# Patient Record
Sex: Male | Born: 1965 | Hispanic: No | Marital: Married
Health system: Northeastern US, Academic
[De-identification: ages and names within clinical notes are randomized; demographics above are authoritative.]

---

## 2009-10-01 IMAGING — CT CT MAXILLOFACIAL W/O CM
1 series · 15 of 30 positions shown, 19 images · IV contrast (agent unspecified)
Comparison: none

CLINICAL DATA: Trauma.
MAXILLOFACIAL CT WITHOUT CONTRAST:
TECHNIQUE: Axial and coronal CT imaging was performed through the maxillofacial structures.  No intravenous contrast was administered.

[Series 3: facial 2.0 h32s · axial · 0.39mm/px · z∈[+16,+200]mm · 15 of 133 slices shown, 19 images]
[im 5/133  brain]
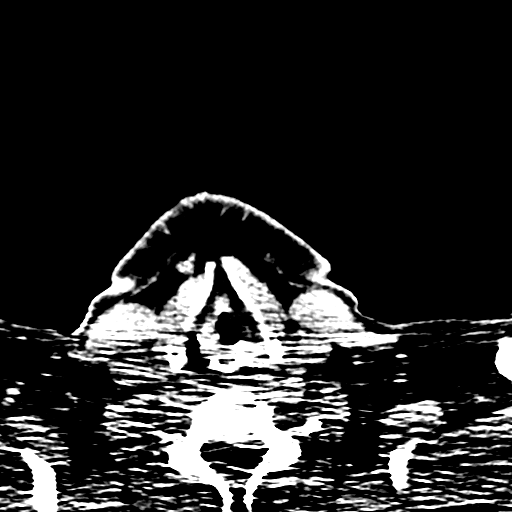
[im 5/133  bone]
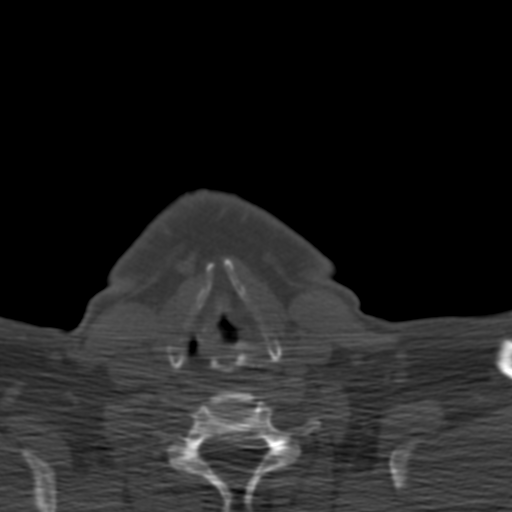
[im 14/133  bone]
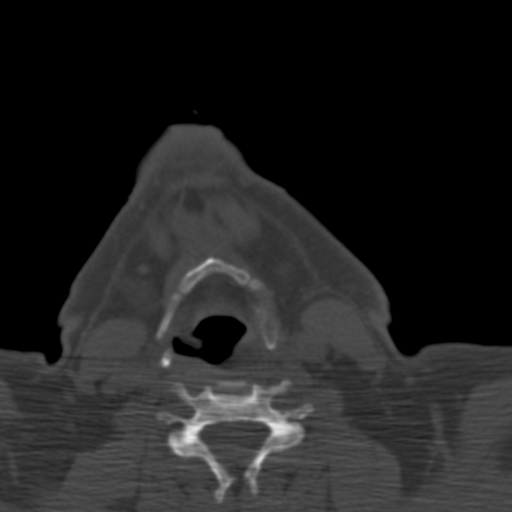
[im 23/133  bone]
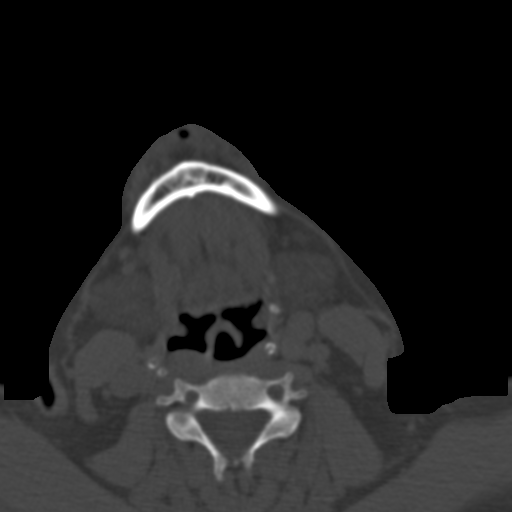
[im 32/133  bone]
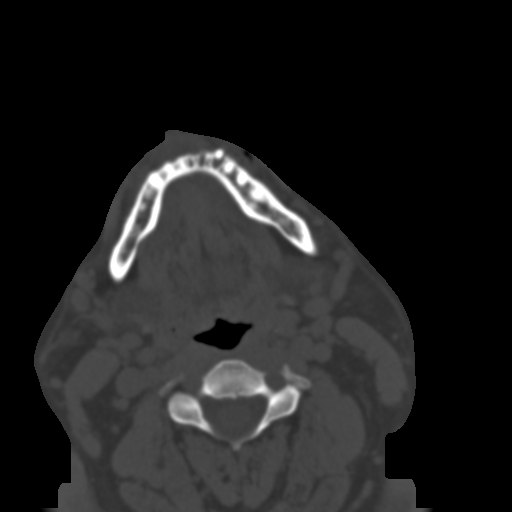
[im 41/133  brain]
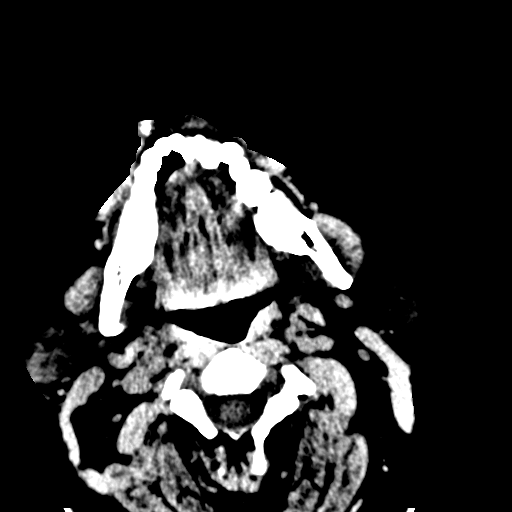
[im 41/133  bone]
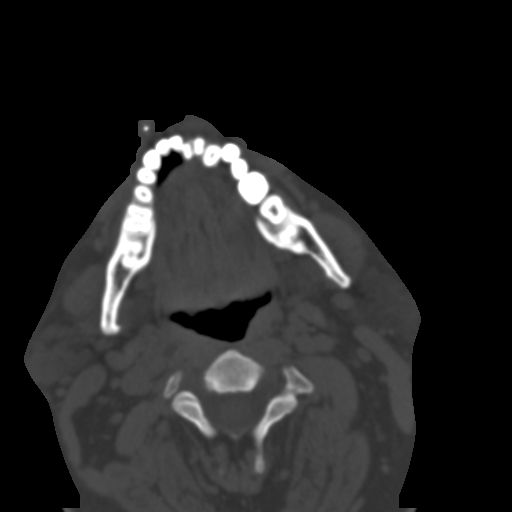
[im 51/133  bone]
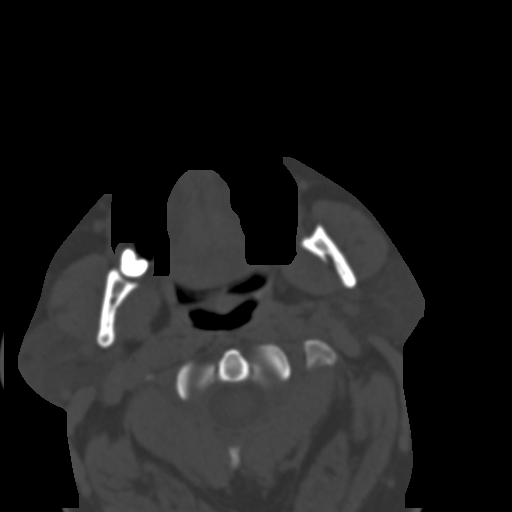
[im 60/133  bone]
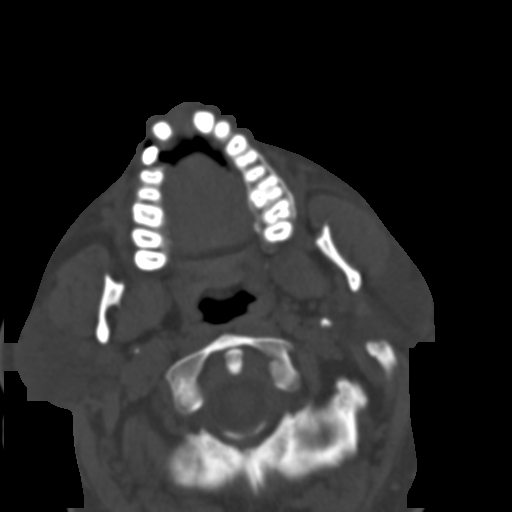
[im 69/133  bone]
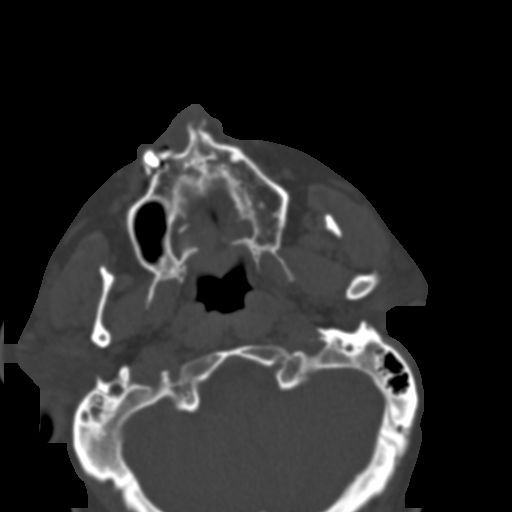
[im 73/133  brain]
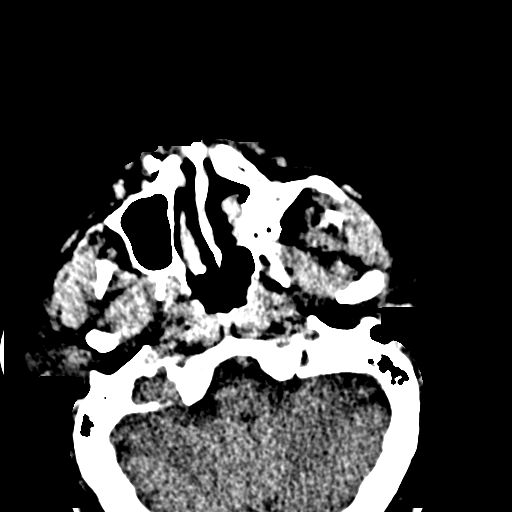
[im 73/133  bone]
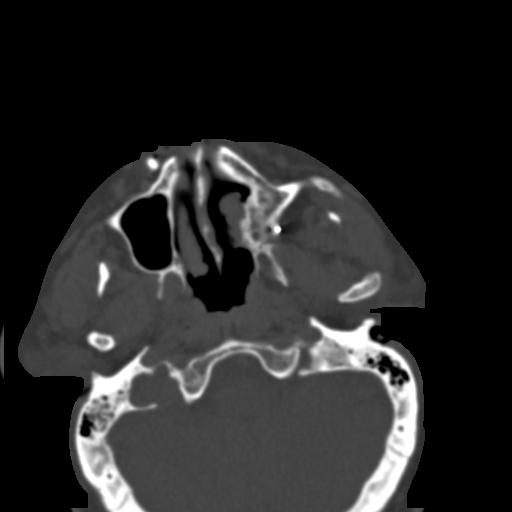
[im 82/133  bone]
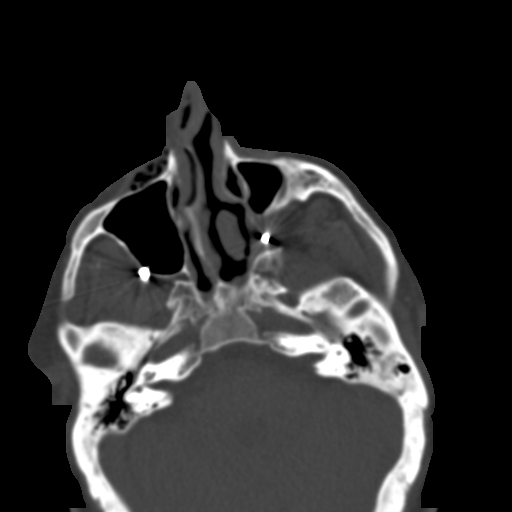
[im 92/133  bone]
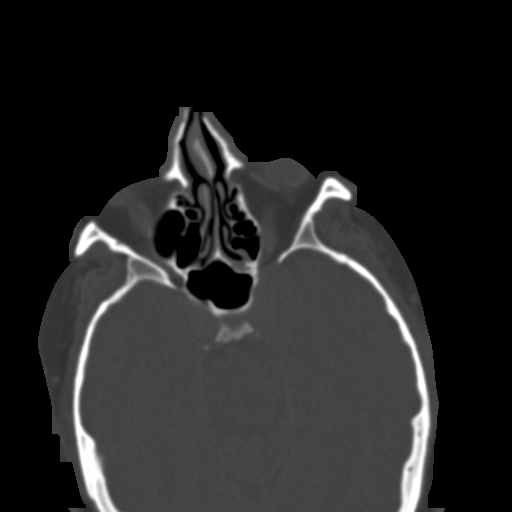
[im 101/133  bone]
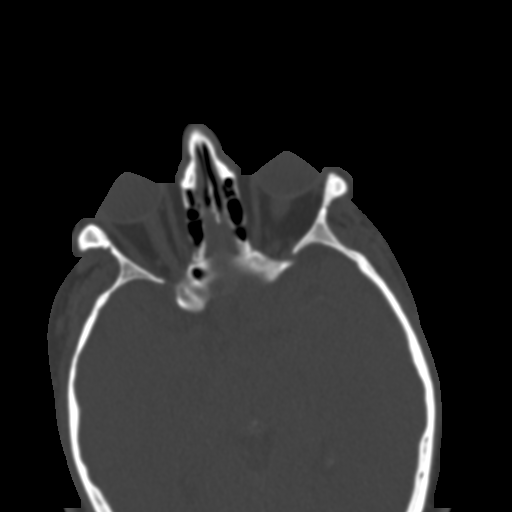
[im 110/133  brain]
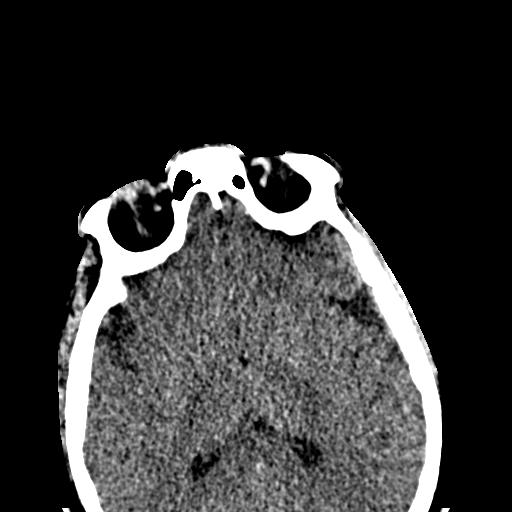
[im 110/133  bone]
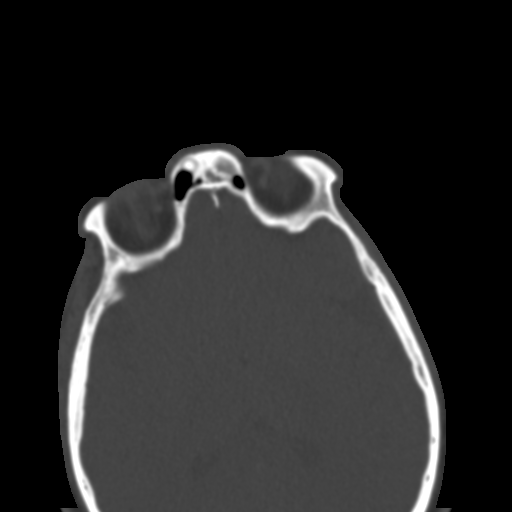
[im 119/133  bone]
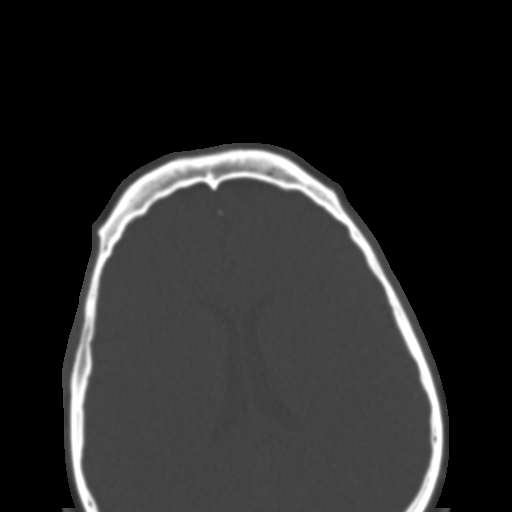
[im 128/133  bone]
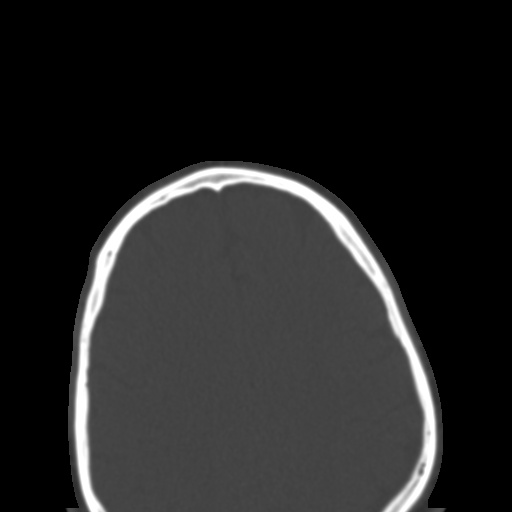

[15 of 30 positions shown; findings below may reference images not displayed]

FINDINGS: Internally sclerotic and peripherally hypodense area adjacent to the right frontal sinus in the frontal bone may represent an enchondroma or other benign etiology.  Bilateral age indeterminate nasal bone fractures are identified with leftward deviation of the nose.  Multiple nondisplaced midline and paramidline mandible and maxillary acute fracture lines are visible.  Overlying soft tissue swelling and soft tissue defect with radiopaque foreign bodies is noted.  Metallic hardware is in place at the posterior aspect of the left maxillary sinus which may reflect prior trauma as well as the posterolateral aspect of the right maxillary sinus.  No fracture line is seen there.  Cerclage wires also noted at the angle of the mandible on the right.  Material in the bilateral external auditory canals may represent cerumen.  Orbits are intact.
IMPRESSION: 1.  Acute midline and paramidline maxilla and mandibular fractures with overlying lower lip soft tissue defect.
2.  Bilateral nasal bone fractures.
Findings called to Dr. Gevara by Dr. Marinez at [DATE] on 12/23/06.

## 2019-04-02 LAB — CBC WITH AUTO DIFFERENTIAL
BKR WAM ABSOLUTE IMMATURE GRANULOCYTES: 0 x 1000/ÂµL (ref 0.0–0.3)
BKR WAM ABSOLUTE LYMPHOCYTE COUNT: 1.8 x 1000/ÂµL (ref 1.0–4.0)
BKR WAM ABSOLUTE NRBC: 0 x 1000/ÂµL (ref 0.0–0.0)
BKR WAM ANALYZER ANC: 3.8 x 1000/ÂµL (ref 1.0–11.0)
BKR WAM BASOPHIL ABSOLUTE COUNT: 0 x 1000/ÂµL (ref 0.0–0.0)
BKR WAM BASOPHILS: 0.3 % (ref 0.0–4.0)
BKR WAM EOSINOPHIL ABSOLUTE COUNT: 0.2 x 1000/ÂµL (ref 0.0–1.0)
BKR WAM EOSINOPHILS: 2.7 % (ref 0.0–7.0)
BKR WAM HEMATOCRIT: 51.9 % (ref 37.0–52.0)
BKR WAM HEMOGLOBIN: 17.3 g/dL (ref 12.0–18.0)
BKR WAM IMMATURE GRANULOCYTES: 0.4 % (ref 0.0–3.0)
BKR WAM LYMPHOCYTES: 26.9 % (ref 8.0–49.0)
BKR WAM MCH (PG): 30.7 pg (ref 27.0–31.0)
BKR WAM MCHC: 33.3 g/dL (ref 31.0–36.0)
BKR WAM MCV: 92.2 fL (ref 78.0–94.0)
BKR WAM MONOCYTE ABSOLUTE COUNT: 0.9 x 1000/ÂµL (ref 0.0–2.0)
BKR WAM MONOCYTES: 13.4 % (ref 4.0–15.0)
BKR WAM MPV: 10.7 fL (ref 6.0–11.0)
BKR WAM NEUTROPHILS: 56.3 % (ref 37.0–84.0)
BKR WAM NUCLEATED RED BLOOD CELLS: 0 % (ref 0.0–1.0)
BKR WAM PLATELETS: 164 x1000/ÂµL (ref 140–440)
BKR WAM RDW-CV: 12.8 % (ref 11.5–14.5)
BKR WAM RED BLOOD CELL COUNT: 5.6 M/ÂµL (ref 3.8–5.9)
BKR WAM WHITE BLOOD CELL COUNT: 6.7 x1000/ÂµL (ref 4.0–10.0)

## 2019-04-02 LAB — URINALYSIS-MACROSCOPIC W/REFLEX MICROSCOPIC
BKR BILIRUBIN, UA: NEGATIVE
BKR BLOOD, UA: NEGATIVE
BKR GLUCOSE, UA: NEGATIVE
BKR LEUKOCYTE ESTERASE, UA: NEGATIVE
BKR NITRITE, UA: NEGATIVE
BKR PH, UA: 6 (ref 5.5–7.5)
BKR SPECIFIC GRAVITY, UA: 1.038 — ABNORMAL HIGH (ref 1.005–1.030)
BKR UROBILINOGEN, UA: <2 EU/dL (ref <=2.0)

## 2019-04-02 LAB — URINE MICROSCOPIC     (BH GH LMW YH)
BKR HYALINE CASTS, UA INSTRUMENT (NUMERIC): 3 /LPF (ref 0–3)
BKR RBC/HPF INSTRUMENT: 2 /HPF (ref 0–2)
BKR WBC/HPF INSTRUMENT: 2 /HPF (ref 0–5)

## 2019-04-02 LAB — BASIC METABOLIC PANEL
BKR ANION GAP: 11 (ref 7–17)
BKR BLOOD UREA NITROGEN: 13 mg/dL (ref 6–20)
BKR BUN / CREAT RATIO: 17.6 (ref 8.0–23.0)
BKR CALCIUM: 9.1 mg/dL (ref 8.8–10.2)
BKR CHLORIDE: 99 mmol/L (ref 98–107)
BKR CO2: 27 mmol/L (ref 20–30)
BKR CREATININE: 0.74 mg/dL (ref 0.40–1.30)
BKR EGFR (AFR AMER): >60 mL/min/{1.73_m2} (ref >60)
BKR EGFR (NON AFRICAN AMERICAN): >60 mL/min/{1.73_m2} (ref >60)
BKR GLUCOSE: 134 mg/dL — ABNORMAL HIGH (ref 70–100)
BKR POTASSIUM: 4.9 mmol/L (ref 3.3–5.1)
BKR SODIUM: 137 mmol/L (ref 136–144)

## 2019-04-02 LAB — MAGNESIUM: BKR MAGNESIUM: 1.9 mg/dL (ref 1.7–2.4)

## 2019-05-18 ENCOUNTER — Inpatient Hospital Stay: Admit: 2019-05-18 | Discharge: 2019-05-18 | Payer: BLUE CROSS/BLUE SHIELD | Primary: Internal Medicine

## 2019-05-18 DIAGNOSIS — Z20828 Contact with and (suspected) exposure to other viral communicable diseases: Secondary | ICD-10-CM

## 2019-05-18 DIAGNOSIS — Z20822 Contact with and (suspected) exposure to covid-19: Secondary | ICD-10-CM

## 2019-05-20 LAB — COVID-19 CLEARANCE OR FOR PLACEMENT ONLY: BKR SARS-COV-2 RNA (COVID-19) (YH): NOT DETECTED

## 2019-09-29 ENCOUNTER — Inpatient Hospital Stay: Admit: 2019-09-29 | Discharge: 2019-09-29 | Payer: BLUE CROSS/BLUE SHIELD | Primary: Internal Medicine

## 2019-09-29 DIAGNOSIS — Z20828 Contact with and (suspected) exposure to other viral communicable diseases: Secondary | ICD-10-CM

## 2019-09-29 DIAGNOSIS — Z20822 Contact with and (suspected) exposure to covid-19: Secondary | ICD-10-CM

## 2019-09-30 LAB — SARS COV-2 (COVID-19) RNA-~~LOC~~ LABS (BH GH LMW YH): BKR SARS-COV-2 RNA (COVID-19) (YH): NOT DETECTED

## 2019-12-01 ENCOUNTER — Inpatient Hospital Stay: Admit: 2019-12-01 | Discharge: 2019-12-01 | Payer: BLUE CROSS/BLUE SHIELD

## 2019-12-01 ENCOUNTER — Emergency Department: Admit: 2019-12-01 | Payer: PRIVATE HEALTH INSURANCE | Attending: Diagnostic Radiology | Primary: Internal Medicine

## 2019-12-01 DIAGNOSIS — R109 Unspecified abdominal pain: Principal | ICD-10-CM

## 2019-12-01 DIAGNOSIS — Z79899 Other long term (current) drug therapy: Secondary | ICD-10-CM

## 2019-12-01 DIAGNOSIS — N200 Calculus of kidney: Secondary | ICD-10-CM

## 2019-12-01 DIAGNOSIS — R112 Nausea with vomiting, unspecified: Secondary | ICD-10-CM

## 2019-12-01 DIAGNOSIS — R197 Diarrhea, unspecified: Secondary | ICD-10-CM

## 2019-12-01 DIAGNOSIS — Z7984 Long term (current) use of oral hypoglycemic drugs: Secondary | ICD-10-CM

## 2019-12-01 DIAGNOSIS — Z885 Allergy status to narcotic agent status: Secondary | ICD-10-CM

## 2019-12-01 LAB — BASIC METABOLIC PANEL
BKR ANION GAP: 12 % (ref 7–17)
BKR BLOOD UREA NITROGEN: 16 mg/dL (ref 6–20)
BKR BUN / CREAT RATIO: 18.6 (ref 8.0–23.0)
BKR CALCIUM: 10 mg/dL (ref 8.8–10.2)
BKR CHLORIDE: 101 mmol/L (ref 98–107)
BKR CO2: 29 mmol/L (ref 20–30)
BKR CREATININE: 0.86 mg/dL (ref 0.40–1.30)
BKR EGFR (AFR AMER): >60 mL/min/{1.73_m2} (ref >60)
BKR EGFR (NON AFRICAN AMERICAN): >60 mL/min/{1.73_m2} (ref >60)
BKR GLUCOSE: 169 mg/dL — ABNORMAL HIGH (ref 70–100)
BKR POTASSIUM: 4 mmol/L (ref 3.3–5.1)
BKR SODIUM: 142 mmol/L (ref 136–144)

## 2019-12-01 LAB — CBC WITH AUTO DIFFERENTIAL
BKR GLUCOSE, UA: 0.3 x 1000/??L (ref 0.0–1.0)
BKR WAM ABSOLUTE IMMATURE GRANULOCYTES: 0 x 1000/??L (ref 0.0–0.3)
BKR WAM ABSOLUTE LYMPHOCYTE COUNT: 3.1 x 1000/??L (ref 1.0–4.0)
BKR WAM ABSOLUTE NRBC: 0 x 1000/ÂµL (ref 0.0–0.0)
BKR WAM ANALYZER ANC: 5.3 x 1000/ÂµL (ref 1.0–11.0)
BKR WAM BASOPHIL ABSOLUTE COUNT: 0.1 x 1000/??L — ABNORMAL HIGH (ref 0.0–0.0)
BKR WAM BASOPHILS: 0.5 % (ref 0.0–4.0)
BKR WAM EOSINOPHIL ABSOLUTE COUNT: 0.3 x 1000/ÂµL (ref 0.0–1.0)
BKR WAM EOSINOPHILS: 2.9 % — AB (ref 0.0–7.0)
BKR WAM HEMATOCRIT: 48.2 % (ref 37.0–52.0)
BKR WAM HEMOGLOBIN: 16.5 g/dL (ref 12.0–18.0)
BKR WAM IMMATURE GRANULOCYTES: 0.3 % (ref 0.0–3.0)
BKR WAM LYMPHOCYTES: 33.1 % (ref 8.0–49.0)
BKR WAM MCH (PG): 31.3 pg — ABNORMAL HIGH (ref 27.0–31.0)
BKR WAM MCHC: 34.2 g/dL (ref 31.0–36.0)
BKR WAM MCV: 91.5 fL (ref 78.0–94.0)
BKR WAM MONOCYTE ABSOLUTE COUNT: 0.7 x 1000/ÂµL (ref 0.0–2.0)
BKR WAM MONOCYTES: 7.8 % (ref 4.0–15.0)
BKR WAM MPV: 10.7 fL (ref 6.0–11.0)
BKR WAM NEUTROPHILS: 55.4 % (ref 37.0–84.0)
BKR WAM NUCLEATED RED BLOOD CELLS: 0 % (ref 0.0–1.0)
BKR WAM PLATELETS: 235 x1000/ÂµL (ref 140–440)
BKR WAM RDW-CV: 12.5 % (ref 11.5–14.5)
BKR WAM RED BLOOD CELL COUNT: 5.3 M/??L (ref 3.8–5.9)
BKR WAM WHITE BLOOD CELL COUNT: 9.5 x1000/ÂµL (ref 4.0–10.0)

## 2019-12-01 LAB — URINE MICROSCOPIC     (BH GH LMW YH): BKR RBC/HPF: >30 /HPF — AB (ref 0–2)

## 2019-12-01 LAB — URINALYSIS WITH CULTURE REFLEX      (BH LMW YH)
BKR BILIRUBIN, UA: NEGATIVE
BKR KETONES, UA: NEGATIVE % — AB (ref 0.0–4.0)
BKR LEUKOCYTE ESTERASE, UA: NEGATIVE
BKR NITRITE, UA: NEGATIVE % (ref 0.0–1.0)
BKR PH, UA: 6 /HPF (ref 5.5–7.5)
BKR PROTEIN, UA: NEGATIVE
BKR SPECIFIC GRAVITY, UA: 1.029 — ABNORMAL HIGH (ref 1.005–1.020)
BKR UROBILINOGEN, UA: 0.2 EU/dL (ref <=2.0)

## 2019-12-01 LAB — UA REFLEX CULTURE

## 2019-12-01 MED ORDER — SODIUM CHLORIDE 0.9 % BOLUS (NEW BAG)
0.9 % | INTRAVENOUS | Status: CP
Start: 2019-12-01 — End: ?
  Administered 2019-12-01: 22:00:00 0.9 mL/h via INTRAVENOUS

## 2019-12-01 MED ORDER — KETOROLAC 15 MG/ML INJECTION SOLUTION
15 mg/mL | Freq: Once | INTRAVENOUS | Status: CP
Start: 2019-12-01 — End: ?
  Administered 2019-12-01: 20:00:00 15 mL via INTRAVENOUS

## 2019-12-01 MED ORDER — ATORVASTATIN 10 MG TABLET
10 mg | Freq: Every evening | ORAL | Status: AC
Start: 2019-12-01 — End: ?

## 2019-12-01 MED ORDER — METFORMIN IMMEDIATE RELEASE 500 MG TABLET
500 mg | Freq: Two times a day (BID) | ORAL | Status: AC
Start: 2019-12-01 — End: ?

## 2019-12-01 NOTE — Discharge Instructions
Please stay hydrated, drink plenty of fluids, take ibuprofen as needed for flank pain, and follow-up with your doctor in 1 week to try to speak with him about why you get recurrent kidney stones.  Your lab showed no signs of infection.  If you develop worsening pain, nausea, vomiting, worsening abdominal pain, or other concerning symptoms please call your doctor or return to the emergency department.

## 2019-12-01 NOTE — ED Notes
3:17 PM Pt upset about wait time and requesting to be seen immediately. Pt educated regarding wait time and he will be seen as soon as a room is available. Pt verbalized understanding.

## 2019-12-01 NOTE — ED Notes
3:47 PM Pt ambulatory from triage & changed into gown. Pt a&o x4, speaking in clear & full sentences. Pt complaining of left sided flank pain beginning 2 hours ago. Pt reports intermittent pain. Pt anxious and restless in stretcher I can't get comfortable. Pt reports hx of kidney stones. Pt denying any dysuria, abd pain, fevers/chills, n/v. PIV placed, labs sent. Awaiting provider eval. Will cont to monitor. No past medical history on file.6:00 PM 2nd liter of NS Infusing. Pt reporting improvement in pain now 3/10 to left flank. Pending dispo. 6:36 PMPt ambulatory to bathroom w/ steady gait. Pending dispo. VSS. 6:55 PMPt verbalized understanding of d/c instructions. VSS. PIV removed. Pt ambulatory out of ED w/ steady gait. Pt denying any chest pain/SOB and any other concerns at this time.

## 2019-12-02 NOTE — ED Provider Notes
HistoryChief Complaint Patient presents with ? Flank Pain   Pt c/o left flank pain x1-2 hrs that occurred while at grocery store. H/o kidney stones, reporters similar pain. Pt reports one episode nonbloody emesis and one episode of nonbloody diarrhea today. Denies dysuria, hematuria, fever, chills, CP, SOB, cough. Reports covid 19 vaccinated. A/Ox4. Speaking full, clear sentences. Pt noted to be visibly uncomfortable in triage.   54 year old male with a history of kidney stones presents emergency department after sudden onset of left-sided flank pain while he was at the store.  Patient reports the pain is constant with waves of amplitude.  Patient had an episode of nausea and vomited once.  Patient denies fever, recent heavy lifting, falls, belly pain, chest pain, shortness of breath, cough.  Patient states this feels like kidney stones that he has had in the past.The history is provided by the patient. OtherThis is a new problem. The current episode started 1 to 2 hours ago. The problem occurs constantly. The problem has not changed since onset.Pertinent negatives include no chest pain, no abdominal pain, no headaches and no shortness of breath. Nothing aggravates the symptoms. Nothing relieves the symptoms. He has tried nothing for the symptoms. The treatment provided no relief.  No past medical history on file.No past surgical history on file.No family history on file.Social History Socioeconomic History ? Marital status: Married   Spouse name: Not on file ? Number of children: Not on file ? Years of education: Not on file ? Highest education level: Not on file Tobacco Use ? Smoking status: Never Smoker Substance and Sexual Activity ? Alcohol use: No ? Drug use: No Social History Narrative  ** Merged History Encounter **    ED Other Social History E-cigarette/Vaping Substances E-cigarette/Vaping Devices Review of Systems Constitutional: Negative for chills, fatigue and fever. HENT: Negative for congestion.  Respiratory: Negative for apnea, chest tightness and shortness of breath.  Cardiovascular: Negative for chest pain. Gastrointestinal: Positive for nausea and vomiting. Negative for abdominal pain. Genitourinary: Positive for flank pain. Negative for difficulty urinating. Musculoskeletal: Negative for arthralgias and back pain. Neurological: Negative for headaches. All other systems reviewed and are negative. Physical ExamED Triage Vitals [12/01/19 1514]BP: (!) 159/94Pulse: 67Pulse from  O2 sat: n/aResp: 20Temp: 97.1 ?F (36.2 ?C)Temp src: TemporalSpO2: 98 % BP 132/86  - Pulse 81  - Temp 97.1 ?F (36.2 ?C) (Temporal)  - Resp 20  - Ht 5' 8 (1.727 m)  - Wt 108.9 kg  - SpO2 98%  - BMI 36.49 kg/m? Physical ExamVitals and nursing note reviewed. Constitutional:     Appearance: Normal appearance. He is obese. HENT:    Head: Normocephalic. Eyes:    Extraocular Movements: Extraocular movements intact.    Conjunctiva/sclera: Conjunctivae normal.    Pupils: Pupils are equal, round, and reactive to light. Cardiovascular:    Rate and Rhythm: Normal rate and regular rhythm.    Pulses: Normal pulses.    Heart sounds: Normal heart sounds. Pulmonary:    Effort: Pulmonary effort is normal.    Breath sounds: Normal breath sounds. Abdominal:    General: Abdomen is flat. There is no distension.    Tenderness: There is no abdominal tenderness. There is left CVA tenderness. There is no right CVA tenderness. Musculoskeletal:       General: Tenderness present. No deformity. Normal range of motion.    Cervical back: Normal range of motion and neck supple. Skin:   General: Skin is warm and dry.    Capillary Refill: Capillary refill  takes less than 2 seconds.    Findings: No rash. Neurological:    General: No focal deficit present.    Mental Status: He is alert and oriented to person, place, and time. Psychiatric:       Behavior: Behavior normal.  ProceduresProceduresResident/APP MDM:- likely kidney stone, no abdominal tenderness so less concerning for abdominal pathology, less concern for MSK pain with no inciting incident.- patient feels better after Toradol and 1 L of IV fluids.  Patient feels that symptoms have almost resolved.  Will give patient 1 more L of fluids and reassess- patient feels full relief of symptoms.  Patient will follow-up with his primary care doctor to talk about why he gets recurrent kidney stones.ED COURSEReviewed previous: previous chartInterpreted by ED Provider: labs, ultrasound and pulse oximetryPatient Reevaluation: ED Attestation: PA/APRNFace to face evaluation was performed by me in collaboration with the Advanced Practice Provider to assess for significant health threats. On my exam:  54 year old male with past medical history of renal stones presents to the ED with acute onset of left flank pain.  Patient reports that this feels like his prior renal stones.  Left CVA tenderness on exam.  No fevers.  Denies any dysuria or hematuria.  Had 1 episode of vomiting.  My differential includes:  Renal stone, UTI, pyelonephritis.  Urinalysis positive for hematuria, no leukocytes or nitrites or bacteria.  No hydronephrosis on bedside renal ultrasound.  Given normal saline and Toradol and symptoms improved.  Disposition:  DischargeCaitlin Ryus, MD, MPHEmergency MedicineContact available on MHCritical care provided by attending: no critical carePatient progress: improvedClinical Impressions as of Nov 30 2132 Kidney stone  ED DispositionDischarge Ryus, Luther Parody, MD09/19/21 1854 Sherron Monday, PA09/19/21 2134

## 2020-02-17 ENCOUNTER — Inpatient Hospital Stay: Admit: 2020-02-17 | Discharge: 2020-02-17 | Payer: BLUE CROSS/BLUE SHIELD

## 2020-02-17 ENCOUNTER — Encounter: Admit: 2020-02-17 | Payer: PRIVATE HEALTH INSURANCE | Attending: Pediatrics | Primary: Internal Medicine

## 2020-02-17 ENCOUNTER — Emergency Department: Admit: 2020-02-17 | Payer: PRIVATE HEALTH INSURANCE | Primary: Internal Medicine

## 2020-02-17 DIAGNOSIS — N202 Calculus of kidney with calculus of ureter: Secondary | ICD-10-CM

## 2020-02-17 DIAGNOSIS — Z885 Allergy status to narcotic agent status: Secondary | ICD-10-CM

## 2020-02-17 DIAGNOSIS — Z79899 Other long term (current) drug therapy: Secondary | ICD-10-CM

## 2020-02-17 DIAGNOSIS — R109 Unspecified abdominal pain: Principal | ICD-10-CM

## 2020-02-17 DIAGNOSIS — Z7984 Long term (current) use of oral hypoglycemic drugs: Secondary | ICD-10-CM

## 2020-02-17 LAB — CBC WITH AUTO DIFFERENTIAL
BKR WAM ABSOLUTE IMMATURE GRANULOCYTES.: 0.06 x 1000/??L (ref 0.00–0.30)
BKR WAM ABSOLUTE LYMPHOCYTE COUNT.: 2.84 x 1000/??L (ref 0.60–3.70)
BKR WAM ABSOLUTE NRBC (2 DEC): 0 x 1000/??L (ref 0.00–1.00)
BKR WAM ANALYZER ANC: 5.41 x 1000/??L (ref 2.00–7.60)
BKR WAM BASOPHIL ABSOLUTE COUNT.: 0.07 x 1000/??L (ref 0.00–1.00)
BKR WAM BASOPHILS: 0.8 % (ref 0.0–1.4)
BKR WAM EOSINOPHIL ABSOLUTE COUNT.: 0 x 1000/??L (ref 0.00–1.00)
BKR WAM EOSINOPHILS: 0 % (ref 0.0–5.0)
BKR WAM HEMATOCRIT (2 DEC): 47.3 % (ref 38.50–50.00)
BKR WAM HEMOGLOBIN: 16.7 g/dL (ref 13.2–17.1)
BKR WAM IMMATURE GRANULOCYTES: 0.7 % (ref 0.0–1.0)
BKR WAM LYMPHOCYTES: 31.6 % (ref 17.0–50.0)
BKR WAM MCH (PG): 31.8 pg (ref 27.0–33.0)
BKR WAM MCHC: 35.3 g/dL (ref 31.0–36.0)
BKR WAM MCV: 90.1 fL (ref 80.0–100.0)
BKR WAM MONOCYTE ABSOLUTE COUNT.: 0.6 x 1000/??L (ref 0.00–1.00)
BKR WAM MONOCYTES: 6.7 % (ref 4.0–12.0)
BKR WAM MPV: 10.4 fL (ref 8.0–12.0)
BKR WAM NEUTROPHILS: 60.2 % (ref 39.0–72.0)
BKR WAM NUCLEATED RED BLOOD CELLS: 0 % (ref 0.0–1.0)
BKR WAM PLATELETS: 243 x1000/??L (ref 150–420)
BKR WAM RDW-CV: 12.5 % (ref 11.0–15.0)
BKR WAM RED BLOOD CELL COUNT.: 5.25 M/ÂµL (ref 4.00–6.00)
BKR WAM WHITE BLOOD CELL COUNT: 9 x1000/??L (ref 4.0–11.0)

## 2020-02-17 LAB — URINALYSIS WITH CULTURE REFLEX      (BH LMW YH)
BKR BILIRUBIN, UA: NEGATIVE mg/dL (ref 6–20)
BKR KETONES, UA: NEGATIVE
BKR LEUKOCYTE ESTERASE, UA: NEGATIVE
BKR NITRITE, UA: NEGATIVE
BKR PH, UA: 5.5 (ref 5.5–7.5)
BKR PROTEIN, UA: NEGATIVE
BKR SPECIFIC GRAVITY, UA: 1.027 — ABNORMAL HIGH (ref 1.005–1.020)
BKR UROBILINOGEN, UA: 0.2 EU/dL (ref <=2.0)

## 2020-02-17 LAB — BASIC METABOLIC PANEL
BKR ANION GAP: 13 g/dL (ref 7–17)
BKR BLOOD UREA NITROGEN: 13 mg/dL (ref 6–20)
BKR BUN / CREAT RATIO: 17.8 % (ref 8.0–23.0)
BKR CALCIUM: 9.7 mg/dL (ref 8.8–10.2)
BKR CHLORIDE: 99 mmol/L (ref 98–107)
BKR CO2: 27 mmol/L (ref 20–30)
BKR CREATININE: 0.73 mg/dL (ref 0.40–1.30)
BKR EGFR (AFR AMER): >60 mL/min/{1.73_m2} (ref >60)
BKR EGFR (NON AFRICAN AMERICAN): >60 mL/min/1.73m2 (ref >60)
BKR GLUCOSE, UA: 27 mmol/L (ref 20–30)
BKR GLUCOSE: 183 mg/dL — ABNORMAL HIGH (ref 70–100)
BKR POTASSIUM: 4.5 mmol/L (ref 3.3–5.3)
BKR SODIUM: 139 mmol/L — ABNORMAL HIGH (ref 136–144)

## 2020-02-17 LAB — URINE MICROSCOPIC     (BH GH LMW YH)

## 2020-02-17 LAB — UA REFLEX CULTURE

## 2020-02-17 MED ORDER — SODIUM CHLORIDE 0.9 % BOLUS (NEW BAG)
0.9 % | INTRAVENOUS | Status: CP
Start: 2020-02-17 — End: ?
  Administered 2020-02-17: 14:00:00 0.9 mL/h via INTRAVENOUS

## 2020-02-17 MED ORDER — MORPHINE 4 MG/ML INTRAVENOUS SOLUTION
4 mg/mL | Freq: Once | INTRAVENOUS | Status: CP
Start: 2020-02-17 — End: ?
  Administered 2020-02-17: 16:00:00 4 mL via INTRAVENOUS

## 2020-02-17 MED ORDER — TAMSULOSIN 0.4 MG CAPSULE
0.4 mg | Status: CP
Start: 2020-02-17 — End: ?

## 2020-02-17 MED ORDER — SODIUM CHLORIDE 0.9 % BOLUS (NEW BAG)
0.9 % | INTRAVENOUS | Status: CP
Start: 2020-02-17 — End: ?
  Administered 2020-02-17: 16:00:00 0.9 mL/h via INTRAVENOUS

## 2020-02-17 MED ORDER — OXYCODONE-ACETAMINOPHEN 5 MG-325 MG TABLET
5-325 mg | ORAL_TABLET | Freq: Three times a day (TID) | ORAL | 1 refills | Status: AC | PRN
Start: 2020-02-17 — End: ?

## 2020-02-17 MED ORDER — KETOROLAC 15 MG/ML INJECTION SOLUTION
15 mg/mL | Freq: Once | INTRAVENOUS | Status: CP
Start: 2020-02-17 — End: ?
  Administered 2020-02-17: 14:00:00 15 mL via INTRAVENOUS

## 2020-02-17 MED ORDER — TAMSULOSIN 0.4 MG CAPSULE
0.4 mg | Freq: Once | ORAL | Status: CP
Start: 2020-02-17 — End: ?
  Administered 2020-02-17: 19:00:00 0.4 mg via ORAL

## 2020-02-17 MED ORDER — OXYCODONE-ACETAMINOPHEN 5 MG-325 MG TABLET
5-325 mg | Freq: Once | ORAL | Status: CP
Start: 2020-02-17 — End: ?
  Administered 2020-02-17: 19:00:00 5-325 mg via ORAL

## 2020-02-17 MED ORDER — IBUPROFEN 600 MG TABLET
600 mg | Freq: Once | ORAL | Status: CP
Start: 2020-02-17 — End: ?
  Administered 2020-02-17: 18:00:00 600 mg via ORAL

## 2020-02-17 MED ORDER — TAMSULOSIN 0.4 MG CAPSULE
0.4 mg | ORAL_CAPSULE | Freq: Every day | ORAL | 1 refills | Status: AC
Start: 2020-02-17 — End: ?

## 2020-02-17 MED ORDER — MORPHINE 4 MG/ML INTRAVENOUS SOLUTION
4 mg/mL | Freq: Once | INTRAVENOUS | Status: CP
Start: 2020-02-17 — End: ?
  Administered 2020-02-17: 19:00:00 4 mL via INTRAVENOUS

## 2020-02-17 MED ORDER — OXYCODONE-ACETAMINOPHEN 5 MG-325 MG TABLET
5-325 mg | Freq: Once | ORAL | Status: CP
Start: 2020-02-17 — End: ?
  Administered 2020-02-17: 16:00:00 5-325 mg via ORAL

## 2020-02-17 NOTE — ED Notes
8:38 AM Pain med given per MAR. Urine collected and sent. Pending results, pt verbalized understanding POC. 11:08 AM Pain meds given per MAR, add't IVF given per [facility], will continue to monitor. 12:54 PM Pt stated some relief w/ pain meds, MD notified, will have [place] scan. 2:07 PM MD at bedside updating pt on results. 2:20 PM Pt ok for d/c per MD. Pt verbalized understanding of all discharge paperwork explained by RN Nolberto Hanlon. PIV removed. VSS. Pt denied any further questions. A/O4x, in no acute distress, pain controlled. Pt ambulated around ED with steady gait w/ all personal belongings.

## 2020-02-17 NOTE — Discharge Instructions
Your [place] scan shows a kidney stone.  Take the tamsulosin as prescribed.  Take ibuprofen 600 milligrams every 8 hours with food in addition to the Percocet as prescribed for pain.  Follow-up with your doctor.  Use the strainer to see if you can catch the kidney stone so your doctor can analyze this.  Come back to the ER for any worse pain, fever, trouble urinating, vomiting or any other changes or concerns.

## 2020-02-17 NOTE — ED Provider Notes
HistoryChief Complaint Patient presents with ? Flank Pain   left sided flank pain  The history is provided by the patient. OtherThis is a new problem. The current episode started 1 to 2 hours ago. The problem occurs constantly. Associated symptoms include abdominal pain. Pertinent negatives include no chest pain, no headaches and no shortness of breath. Nothing aggravates the symptoms. Nothing relieves the symptoms. The treatment provided no relief.  No past medical history on file.No past surgical history on file.No family history on file.Social History Socioeconomic History ? Marital status: Married   Spouse name: Not on file ? Number of children: Not on file ? Years of education: Not on file ? Highest education level: Not on file Tobacco Use ? Smoking status: Never Smoker Substance and Sexual Activity ? Alcohol use: No ? Drug use: No Social History Narrative  ** Merged History Encounter **    ED Other Social History E-cigarette/Vaping Substances E-cigarette/Vaping Devices Review of Systems Constitutional: Negative for fever. Respiratory: Negative for shortness of breath.  Cardiovascular: Negative for chest pain. Gastrointestinal: Positive for abdominal pain. Negative for nausea and vomiting. Genitourinary: Positive for difficulty urinating and flank pain. Negative for dysuria. Neurological: Negative for headaches. All other systems reviewed and are negative. Physical ExamED Triage Vitals [02/17/20 0813]BP: (!) 158/103Pulse: 77Pulse from  O2 sat: n/aResp: 20Temp: 97.9 ?F (36.6 ?C)Temp src: OralSpO2: 97 % BP (!) 136/94  - Pulse 76  - Temp 97.8 ?F (36.6 ?C) (Temporal)  - Resp 18  - Ht 5' 8 (1.727 m)  - Wt 108.9 kg  - SpO2 97%  - BMI 36.49 kg/m? Physical ExamVitals and nursing note reviewed. Constitutional:     General: He is not in acute distress.   Appearance: He is well-developed. He is not diaphoretic. HENT:    Head: Normocephalic. Eyes:    Extraocular Movements: Extraocular movements intact. Cardiovascular:    Rate and Rhythm: Normal rate. Pulmonary:    Effort: Pulmonary effort is normal. No respiratory distress. Abdominal:    Palpations: Abdomen is soft.    Tenderness: There is no abdominal tenderness. There is left CVA tenderness. Musculoskeletal:       General: Normal range of motion.    Cervical back: Normal range of motion and neck supple. Skin:   General: Skin is warm and dry. Neurological:    General: No focal deficit present.    Mental Status: He is alert and oriented to person, place, and time. Psychiatric:       Mood and Affect: Mood normal.       Behavior: Behavior normal.       Thought Content: Thought content normal.       Judgment: Judgment normal.  ProceduresProcedures ED COURSEPatient Reevaluation: Patient with history of kidney stones, last seen here several months ago for the same symptoms, now feels identical, left-sided flank pain, radiates forward, no urinary symptoms although has been going only small amounts.  No fever, he is not worried about any other etiology, for this reason no indication for imaging since this would not really change management, even if he has mild hydro on bedside ultrasound.  In for labs to check creatinine and UA, treat symptomatically and reassess.2:15 PMHad persistent pain so [place] scan was performed, has a smaller stone that should pass, feeling somewhat improved, will give another round of medication and then a short course of Percocet for home, take this with ibuprofen along with Flomax, follow-up with his doctor, he is comfortable with this plan, strict return precautions discussed, safe  for discharge.Clinical Impressions as of Feb 17 1420 Kidney stone  ED DispositionDischarge Arther Dames, MD12/06/21 1421

## 2020-02-17 NOTE — ED Notes
8:36 AM patient presents with co left flank pain. He states pain same as last kidney stone. Co flank pain no radiation to abdomen. Increased urge to urinate, decreased flow. Medicated as ordered

## 2020-02-17 NOTE — ED Notes
2:31 PM patient medicated for pain per order

## 2020-02-19 ENCOUNTER — Inpatient Hospital Stay: Admit: 2020-02-19 | Discharge: 2020-02-19 | Payer: BLUE CROSS/BLUE SHIELD

## 2020-02-19 ENCOUNTER — Encounter: Admit: 2020-02-19 | Payer: PRIVATE HEALTH INSURANCE | Primary: Internal Medicine

## 2020-02-19 LAB — BASIC METABOLIC PANEL
BKR ANION GAP: 10 (ref 7–17)
BKR BLOOD UREA NITROGEN: 12 mg/dL (ref 6–20)
BKR BUN / CREAT RATIO: 10.5 (ref 8.0–23.0)
BKR CALCIUM: 9.4 mg/dL (ref 8.8–10.2)
BKR CHLORIDE: 98 mmol/L (ref 98–107)
BKR CO2: 28 mmol/L (ref 20–30)
BKR CREATININE: 1.14 mg/dL (ref 0.40–1.30)
BKR EGFR (AFR AMER): >60 mL/min/{1.73_m2} (ref >60)
BKR EGFR (NON AFRICAN AMERICAN): >60 mL/min/{1.73_m2} (ref >60)
BKR GLUCOSE: 116 mg/dL — ABNORMAL HIGH (ref 70–100)
BKR POTASSIUM: 4.3 mmol/L (ref 3.3–5.3)
BKR SODIUM: 136 mmol/L (ref 136–144)
BKR WAM HEMOGLOBIN: 10 g/dL (ref 7–17)

## 2020-02-19 LAB — URINALYSIS WITH CULTURE REFLEX      (BH LMW YH)
BKR BILIRUBIN, UA: NEGATIVE
BKR BLOOD, UA: NEGATIVE
BKR GLUCOSE, UA: NEGATIVE
BKR LEUKOCYTE ESTERASE, UA: NEGATIVE
BKR NITRITE, UA: NEGATIVE
BKR PH, UA: 6 (ref 5.5–7.5)
BKR PROTEIN, UA: NEGATIVE
BKR SPECIFIC GRAVITY, UA: 1.021 — ABNORMAL HIGH (ref 1.005–1.020)
BKR UROBILINOGEN, UA: 0.2 EU/dL (ref <=2.0)

## 2020-02-19 LAB — CBC WITH AUTO DIFFERENTIAL
BKR CO2: 7.9 % (ref 4.0–12.0)
BKR WAM ABSOLUTE IMMATURE GRANULOCYTES.: 0.04 x 1000/??L (ref 0.00–0.30)
BKR WAM ABSOLUTE LYMPHOCYTE COUNT.: 1.56 x 1000/ÂµL (ref 0.60–3.70)
BKR WAM ABSOLUTE NRBC (2 DEC): 0 x 1000/??L (ref 0.00–1.00)
BKR WAM ANALYZER ANC: 6.5 x 1000/ÂµL (ref 2.00–7.60)
BKR WAM BASOPHIL ABSOLUTE COUNT.: 0.02 x 1000/??L (ref 0.00–1.00)
BKR WAM BASOPHILS: 0.2 % (ref 0.0–1.4)
BKR WAM EOSINOPHIL ABSOLUTE COUNT.: 0 x 1000/??L (ref 0.00–1.00)
BKR WAM EOSINOPHILS: 0 % (ref 0.0–5.0)
BKR WAM HEMATOCRIT (2 DEC): 44.8 % — ABNORMAL HIGH (ref 38.50–50.00)
BKR WAM IMMATURE GRANULOCYTES: 0.5 % (ref 0.0–1.0)
BKR WAM LYMPHOCYTES: 17.7 % — ABNORMAL LOW (ref 17.0–50.0)
BKR WAM MCH (PG): 31 pg (ref 27.0–33.0)
BKR WAM MCHC: 34.4 g/dL (ref 31.0–36.0)
BKR WAM MCV: 90.3 fL (ref 80.0–100.0)
BKR WAM MONOCYTE ABSOLUTE COUNT.: 0.7 x 1000/??L (ref 0.00–1.00)
BKR WAM MONOCYTES: 7.9 % (ref 4.0–12.0)
BKR WAM NEUTROPHILS: 73.7 % — ABNORMAL HIGH (ref 39.0–72.0)
BKR WAM NUCLEATED RED BLOOD CELLS: 0 % (ref 0.0–1.0)
BKR WAM PLATELETS: 191 x1000/ÂµL (ref 150–420)
BKR WAM RDW-CV: 12.5 % (ref 11.0–15.0)
BKR WAM RED BLOOD CELL COUNT.: 4.96 M/ÂµL (ref 4.00–6.00)
BKR WAM WHITE BLOOD CELL COUNT: 12.5 % — ABNORMAL HIGH (ref 11.0–15.0)
BKR WAM WHITE BLOOD CELL COUNT: 8.8 x1000/??L (ref 4.0–11.0)

## 2020-02-19 MED ORDER — KETOROLAC 15 MG/ML INJECTION SOLUTION
15 mg/mL | Freq: Once | INTRAVENOUS | Status: CP
Start: 2020-02-19 — End: ?
  Administered 2020-02-19: 20:00:00 15 mL via INTRAVENOUS

## 2020-02-19 MED ORDER — SODIUM CHLORIDE 0.9 % BOLUS (NEW BAG)
0.9 % | INTRAVENOUS | Status: CP
Start: 2020-02-19 — End: ?
  Administered 2020-02-19: 20:00:00 0.9 mL/h via INTRAVENOUS

## 2020-02-19 MED ORDER — TAMSULOSIN 0.4 MG CAPSULE
0.4 mg | Freq: Once | ORAL | Status: CP
Start: 2020-02-19 — End: ?
  Administered 2020-02-19: 20:00:00 0.4 mg via ORAL

## 2020-02-19 MED ORDER — HYDROCODONE 5 MG-ACETAMINOPHEN 325 MG TABLET
5-325 mg | ORAL_TABLET | Freq: Four times a day (QID) | ORAL | 1 refills | Status: SS | PRN
Start: 2020-02-19 — End: 2020-03-14

## 2020-02-19 MED ORDER — ACETAMINOPHEN 325 MG TABLET
325 mg | Freq: Once | ORAL | Status: CP
Start: 2020-02-19 — End: ?
  Administered 2020-02-19: 20:00:00 325 mg via ORAL

## 2020-02-19 MED ORDER — HYDROCODONE 5 MG-ACETAMINOPHEN 325 MG TABLET
5-325 mg | ORAL | Status: CP
Start: 2020-02-19 — End: ?
  Administered 2020-02-19: 20:00:00 5-325 mg via ORAL

## 2020-02-19 NOTE — ED Provider Notes
HistoryChief Complaint Patient presents with ? Flank Pain   here the other day and dx with kidney stone, states that he has had continued pain on the L side. states stone was 3-73mm, has not passed yet. alternating percocet and tylenol for pain. last med was at 0800.   Patient is a 54 year old male, past medical history kidney stone diagnosed in the [place] 2 days ago, patient comes in continued pain.  Patient is peeing, denies hematuria.  No fevers no chills no nausea no vomiting just wanted him which longer pains, last last Percocet and ibuprofen today.  Was given Flomax took it yesterday.The history is provided by the patient. Flank PainThis is a new problem. The current episode started more than 2 days ago. The problem occurs constantly. The problem has not changed since onset.Associated symptoms include abdominal pain. Pertinent negatives include no chest pain, no headaches and no shortness of breath. Nothing aggravates the symptoms. Nothing relieves the symptoms. He has tried nothing for the symptoms.  No past medical history on file.No past surgical history on file.History reviewed. No pertinent family history.Social History Socioeconomic History ? Marital status: Married   Spouse name: Not on file ? Number of children: Not on file ? Years of education: Not on file ? Highest education level: Not on file Tobacco Use ? Smoking status: Never Smoker Substance and Sexual Activity ? Alcohol use: No ? Drug use: No Social History Narrative  ** Merged History Encounter **    ED Other Social History E-cigarette/Vaping Substances E-cigarette/Vaping Devices Review of Systems Respiratory: Negative for shortness of breath.  Cardiovascular: Negative for chest pain. Gastrointestinal: Positive for abdominal pain. Genitourinary: Positive for flank pain. Neurological: Negative for headaches. All other systems reviewed and are negative. Physical ExamED Triage Vitals [02/19/20 1334]BP: 124/82Pulse: (!) 92Pulse from  O2 sat: n/aResp: 17Temp: 97.8 ?F (36.6 ?C)Temp src: TemporalSpO2: 96 % BP 124/82  - Pulse (!) 92  - Temp 97.8 ?F (36.6 ?C) (Temporal)  - Resp 17  - SpO2 96% Physical ExamVitals reviewed. Constitutional:     General: He is not in acute distress.   Appearance: He is well-developed. He is not diaphoretic. HENT:    Head: Normocephalic and atraumatic. Eyes:    Pupils: Pupils are equal, round, and reactive to light. Cardiovascular:    Rate and Rhythm: Normal rate.    Heart sounds: Normal heart sounds. Pulmonary:    Effort: Pulmonary effort is normal. Abdominal:    Palpations: Abdomen is soft.    Tenderness: There is no abdominal tenderness. There is left CVA tenderness. There is no right CVA tenderness. Musculoskeletal:    Cervical back: Normal range of motion and neck supple. Skin:   General: Skin is warm and dry. Neurological:    Mental Status: He is alert and oriented to person, place, and time.  ProceduresProcedures  A: Patient is a 54 year old male, past medical history kidney stone diagnosed in the [place] 2 days ago, patient comes in continued pain.Vitals:  02/19/20 1334 BP: 124/82 Pulse: (!) 92 Resp: 17 Temp: 97.8 ?F (36.6 ?C) P:Pt vss, nad, PE benign on exam. Discussed with patient imaging +/- utility. Stone was 3-4 cm on [place], no worsening back pain, no fevers. Will hold off on imaging for now. Will medically tx and check UA for infection. UA-no infection, labs Cr 1.1, slight increase but wnl. No WBC. Feels better after ED tx. rx norco had better response. Caution for use reviewed ,not to mix with percocet. Pt comfortable going home. Strict return precautions  and warning signs reviewed, pt verbalizes understanding of instructions. Dr. Christell Constant available for consult. Ardis Hughs PA-C ED CourseClinical Impressions as of Feb 18 1606 Kidney stone  ED DispositionDischarge Dulcy Fanny, PA12/08/21 4098

## 2020-02-19 NOTE — ED Notes
Patient complains of continuous pain from previously diagnosed kidney stone. He denies any urinary symptoms, fever, nausea, vomiting, or other signs of infectious process. He is ambulatory. A+ox4, gcs15.

## 2020-02-19 NOTE — Discharge Instructions
Take the flomax 1 pill daily to help pass the stone. Take the ibuprofen 600mg  every 6 hours for pain. Take zofran if you get nauseous, norco for break through pain. Drink plenty of water to help flush the kidneys, and prevent constipation. You can take colace as well to prevent constipation, miralax if you need a laxative. Follow up with your doctor. If you develop a fever, chills, worsening pain, please return to the ED.

## 2020-02-20 LAB — UA REFLEX CULTURE

## 2020-03-13 ENCOUNTER — Inpatient Hospital Stay: Admit: 2020-03-13 | Discharge: 2020-03-14 | Payer: PRIVATE HEALTH INSURANCE | Primary: Internal Medicine

## 2020-03-13 ENCOUNTER — Emergency Department: Admit: 2020-03-13 | Payer: PRIVATE HEALTH INSURANCE | Primary: Internal Medicine

## 2020-03-13 ENCOUNTER — Encounter: Admit: 2020-03-13 | Payer: PRIVATE HEALTH INSURANCE | Primary: Internal Medicine

## 2020-03-13 DIAGNOSIS — E785 Hyperlipidemia, unspecified: Secondary | ICD-10-CM

## 2020-03-13 DIAGNOSIS — R109 Unspecified abdominal pain: Principal | ICD-10-CM

## 2020-03-13 DIAGNOSIS — E119 Type 2 diabetes mellitus without complications: Secondary | ICD-10-CM

## 2020-03-13 LAB — BASIC METABOLIC PANEL
BKR ALKALINE PHOSPHATASE: 13 mg/dL (ref 6–20)
BKR ANION GAP: 11 (ref 7–17)
BKR BLOOD UREA NITROGEN: 13 mg/dL (ref 6–20)
BKR BUN / CREAT RATIO: 16.3 (ref 8.0–23.0)
BKR CALCIUM: 9.3 mg/dL (ref 8.8–10.2)
BKR CHLORIDE: 96 mmol/L — ABNORMAL LOW (ref 98–107)
BKR CREATININE: 0.8 mg/dL (ref 0.40–1.30)
BKR EGFR (AFR AMER): >60 mL/min/{1.73_m2} (ref >60)
BKR EGFR (NON AFRICAN AMERICAN): >60 mL/min/{1.73_m2} (ref >60)
BKR GLUCOSE: 153 mg/dL — ABNORMAL HIGH (ref 70–100)
BKR POTASSIUM: 4.8 mmol/L (ref 3.3–5.3)
BKR SODIUM: 133 mmol/L — ABNORMAL LOW (ref 136–144)
BKR WAM HEMATOCRIT (2 DEC): 4.8 mmol/L — ABNORMAL HIGH (ref 3.3–5.3)

## 2020-03-13 LAB — URINALYSIS WITH CULTURE REFLEX      (BH LMW YH)
BKR BILIRUBIN, UA: NEGATIVE
BKR GLUCOSE, UA: NEGATIVE x 1000/??L (ref 0.00–1.00)
BKR KETONES, UA: NEGATIVE
BKR LEUKOCYTE ESTERASE, UA: NEGATIVE
BKR NITRITE, UA: NEGATIVE /HPF
BKR PH, UA: 5.5 x 1000/??L (ref 5.5–7.5)
BKR PROTEIN, UA: NEGATIVE
BKR SPECIFIC GRAVITY, UA: 1.045 x 1000/??L — ABNORMAL HIGH (ref 1.005–1.020)
BKR UROBILINOGEN, UA: 0.2 EU/dL — AB (ref <=2.0)
BKR WAM EOSINOPHILS (DIFF) 2 DEC: NEGATIVE x 1000/??L (ref 0.00–1.00)

## 2020-03-13 LAB — URINE MICROSCOPIC     (BH GH LMW YH)

## 2020-03-13 LAB — LIVER FUNCTION TESTS     (YH)
BKR ALANINE AMINOTRANSFERASE (ALT): 34 U/L — ABNORMAL HIGH (ref 9–59)
BKR ASPARTATE AMINOTRANSFERASE (AST): 29 U/L (ref 10–35)
BKR AST/ALT RATIO: 0.9
BKR BILIRUBIN DIRECT: <0.2 mg/dL (ref <=0.3)
BKR BILIRUBIN TOTAL: 0.4 mg/dL (ref <=1.2)

## 2020-03-13 LAB — MANUAL DIFFERENTIAL
BKR WAM ATYPICAL LYMPHOCYTES (DIFF) 1 DEC: 1 % (ref 0.0–1.0)
BKR WAM BANDS (DIFF) (1 DEC): 1 % (ref 0.0–10.0)
BKR WAM BASOPHIL - ABS (DIFF) 2 DEC: 0 x 1000/ÂµL (ref 0.00–1.00)
BKR WAM BASOPHILS (DIFF): 0 % (ref 0.0–1.4)
BKR WAM EOSINOPHILS (DIFF): 0 % (ref 0.0–5.0)
BKR WAM LYMPHOCYTE - ABS (DIFF) 2 DEC: 2.26 x 1000/ÂµL (ref 0.60–3.70)
BKR WAM LYMPHOCYTES (DIFF): 18 % (ref 17.0–50.0)
BKR WAM MONOCYTE - ABS (DIFF) 2 DEC: 0.83 x 1000/ÂµL (ref 0.00–1.00)
BKR WAM MONOCYTES (DIFF): 7 % (ref 4.0–12.0)
BKR WAM NEUTROPHILS (DIFF): 73 % — ABNORMAL HIGH (ref 39.0–72.0)
BKR WAM NEUTROPHILS - ABS (DIFF) 2 DEC: 8.81 x 1000/ÂµL — ABNORMAL HIGH (ref 2.00–7.60)
BKR WAM NORMAL RBC MORPHOLOGY: NORMAL

## 2020-03-13 LAB — CBC WITH AUTO DIFFERENTIAL
BKR WAM ABSOLUTE NRBC (2 DEC): 0 x 1000/??L (ref 0.00–1.00)
BKR WAM HEMOGLOBIN: 16.4 g/dL (ref 13.2–17.1)
BKR WAM MCH (PG): 30.9 pg (ref 27.0–33.0)
BKR WAM MCHC: 34.7 g/dL (ref 31.0–36.0)
BKR WAM MCV: 89.1 fL (ref 80.0–100.0)
BKR WAM MPV: 10.4 fL (ref 8.0–12.0)
BKR WAM MPV: 16.4 g/dL — ABNORMAL LOW (ref 13.2–17.1)
BKR WAM NUCLEATED RED BLOOD CELLS: 0 % (ref 0.0–1.0)
BKR WAM PLATELETS: 213 x1000/ÂµL (ref 150–420)
BKR WAM RDW-CV: 12.1 % — ABNORMAL HIGH (ref 11.0–15.0)
BKR WAM RED BLOOD CELL COUNT.: 5.3 M/??L (ref 4.00–6.00)

## 2020-03-13 LAB — SARS COV-2 (COVID-19) RNA-~~LOC~~ LABS (BH GH LMW YH): BKR SARS-COV-2 RNA (COVID-19) (YH): NEGATIVE /HPF — AB

## 2020-03-13 LAB — LIPASE: BKR LIPASE: 245 U/L — ABNORMAL HIGH (ref 11–55)

## 2020-03-13 MED ORDER — LIDOCAINE IVPB FOR RENAL COLIC
Freq: Once | INTRAVENOUS | Status: CP
Start: 2020-03-13 — End: ?
  Administered 2020-03-13: 17:00:00 100.000 mL/h via INTRAVENOUS

## 2020-03-13 MED ORDER — SODIUM CHLORIDE 0.9 % BOLUS (NEW BAG)
0.9 % | INTRAVENOUS | Status: CP
Start: 2020-03-13 — End: ?
  Administered 2020-03-13: 17:00:00 0.9 mL/h via INTRAVENOUS

## 2020-03-13 MED ORDER — DEXTROSE 50 % IN WATER (D50W) INTRAVENOUS SYRINGE
INTRAVENOUS | Status: DC | PRN
Start: 2020-03-13 — End: 2020-03-14

## 2020-03-13 MED ORDER — SODIUM CHLORIDE 0.9 % BOLUS (NEW BAG)
0.9 % | Freq: Once | INTRAVENOUS | Status: CP
Start: 2020-03-13 — End: ?
  Administered 2020-03-13: 19:00:00 0.9 mL/h via INTRAVENOUS

## 2020-03-13 MED ORDER — INSULIN LISPRO 100 UNIT/ML (SLIDING SCALE)
100 unit/mL | Freq: Every evening | SUBCUTANEOUS | Status: DC
Start: 2020-03-13 — End: 2020-03-14

## 2020-03-13 MED ORDER — OXYCODONE IMMEDIATE RELEASE 5 MG TABLET
5 mg | ORAL | Status: DC | PRN
Start: 2020-03-13 — End: 2020-03-14

## 2020-03-13 MED ORDER — DEXTROSE 40 % ORAL GEL
40 % | ORAL | Status: DC | PRN
Start: 2020-03-13 — End: 2020-03-14

## 2020-03-13 MED ORDER — KETOROLAC 30 MG/ML (1 ML) INJECTION SOLUTION
30 mg/mL (1 mL) | Freq: Once | INTRAVENOUS | Status: CP
Start: 2020-03-13 — End: ?
  Administered 2020-03-13: 17:00:00 30 mL via INTRAVENOUS

## 2020-03-13 MED ORDER — FLU VACCINE QS 2021-22(6MOS UP)(PF) 60 MCG(15 MCGX4)/0.5 ML IM SYRINGE
60 mcg (15 mcg x 4)/0.5 mL | INTRAMUSCULAR | Status: CP
Start: 2020-03-13 — End: ?
  Administered 2020-03-14: 14:00:00 60 mL via INTRAMUSCULAR

## 2020-03-13 MED ORDER — LIDOCAINE HCL 20 MG/ML (2 %) INJECTION SOLUTION
20 mg/mL (2 %) | Status: CP
Start: 2020-03-13 — End: ?

## 2020-03-13 MED ORDER — GLUCAGON 1 MG/ML IN STERILE WATER
Freq: Once | INTRAMUSCULAR | Status: DC | PRN
Start: 2020-03-13 — End: 2020-03-14

## 2020-03-13 MED ORDER — FRUIT JUICE
ORAL | Status: DC | PRN
Start: 2020-03-13 — End: 2020-03-14

## 2020-03-13 MED ORDER — SODIUM CHLORIDE 0.9 % BOLUS (NEW BAG)
0.9 % | Freq: Once | INTRAVENOUS | Status: CP
Start: 2020-03-13 — End: ?
  Administered 2020-03-13: 15:00:00 0.9 mL/h via INTRAVENOUS

## 2020-03-13 MED ORDER — SODIUM CHLORIDE 0.9 % INTRAVENOUS SOLUTION
INTRAVENOUS | Status: DC
Start: 2020-03-13 — End: 2020-03-14
  Administered 2020-03-14: 06:00:00 via INTRAVENOUS

## 2020-03-13 MED ORDER — ONDANSETRON HCL (PF) 4 MG/2 ML INJECTION SOLUTION
42 mg/2 mL | Freq: Four times a day (QID) | INTRAVENOUS | Status: DC | PRN
Start: 2020-03-13 — End: 2020-03-14

## 2020-03-13 MED ORDER — MORPHINE 4 MG/ML INTRAVENOUS SOLUTION
4 mg/mL | Freq: Once | INTRAVENOUS | Status: CP
Start: 2020-03-13 — End: ?
  Administered 2020-03-13: 19:00:00 4 mL via INTRAVENOUS

## 2020-03-13 MED ORDER — SODIUM CHLORIDE 0.9 % BOLUS (NEW BAG)
0.9 % | Freq: Once | INTRAVENOUS | Status: CP | PRN
Start: 2020-03-13 — End: ?
  Administered 2020-03-13: 16:00:00 0.9 mL/h via INTRAVENOUS

## 2020-03-13 MED ORDER — MORPHINE 2 MG/ML INJECTION SYRINGE
2 mg/mL | INTRAVENOUS | Status: DC | PRN
Start: 2020-03-13 — End: 2020-03-14

## 2020-03-13 MED ORDER — ACETAMINOPHEN 325 MG TABLET
325 mg | Freq: Four times a day (QID) | ORAL | Status: DC
Start: 2020-03-13 — End: 2020-03-14
  Administered 2020-03-13 – 2020-03-14 (×3): 325 mg via ORAL

## 2020-03-13 MED ORDER — MORPHINE 4 MG/ML INTRAVENOUS SOLUTION
4 mg/mL | Freq: Once | INTRAVENOUS | Status: CP
Start: 2020-03-13 — End: ?
  Administered 2020-03-13: 15:00:00 4 mL via INTRAVENOUS

## 2020-03-13 MED ORDER — SODIUM CHLORIDE 0.9 % (FLUSH) INJECTION SYRINGE
0.9 % | Freq: Three times a day (TID) | INTRAVENOUS | Status: DC
Start: 2020-03-13 — End: 2020-03-14

## 2020-03-13 MED ORDER — KETOROLAC 15 MG/ML INJECTION SOLUTION
15 mg/mL | Freq: Four times a day (QID) | INTRAVENOUS | Status: DC | PRN
Start: 2020-03-13 — End: 2020-03-14

## 2020-03-13 MED ORDER — IOHEXOL 350 MG IODINE/ML INTRAVENOUS SOLUTION
350 mg iodine/mL | Freq: Once | INTRAVENOUS | Status: CP | PRN
Start: 2020-03-13 — End: ?
  Administered 2020-03-13: 16:00:00 350 mL via INTRAVENOUS

## 2020-03-13 MED ORDER — SKIM MILK
ORAL | Status: DC | PRN
Start: 2020-03-13 — End: 2020-03-14

## 2020-03-13 MED ORDER — SODIUM CHLORIDE 0.9 % (FLUSH) INJECTION SYRINGE
0.9 % | INTRAVENOUS | Status: DC | PRN
Start: 2020-03-13 — End: 2020-03-14

## 2020-03-13 MED ORDER — TAMSULOSIN 0.4 MG CAPSULE
0.4 mg | Freq: Every evening | ORAL | Status: DC
Start: 2020-03-13 — End: 2020-03-14

## 2020-03-13 MED ORDER — ROSUVASTATIN 5 MG TABLET
5 mg | Freq: Every day | ORAL | Status: DC
Start: 2020-03-13 — End: 2020-03-14
  Administered 2020-03-14: 14:00:00 5 mg via ORAL

## 2020-03-13 MED ORDER — MORPHINE 4 MG/ML INTRAVENOUS SOLUTION
4 mg/mL | Freq: Once | INTRAVENOUS | Status: CP
Start: 2020-03-13 — End: ?
  Administered 2020-03-13: 18:00:00 4 mL via INTRAVENOUS

## 2020-03-13 NOTE — ED Notes
Floor Handoff Telemetry: 	[]  Yes		[]  NoCode Status:   [x]  Full		[]  DNR		[]  DNI		Other (specify):Safety Precautions: [x]  None	[]  Sitter   []  Restraints	[]  Suicidal	[]  Fall Risk	Other (specify):Mentation/Orientation:	 A&O (Self, person, place, time) x     4    	 Disoriented to:                    	 Deficits: []  Hearing impaired	[]  Blind  []  Nonverbal	 []  Mental retardationOxygenation Upon Admission: [x]  RA	[]  NC	[]  Venti  []  Simple Mask []  Other	Baseline O2 Status? []  Yes	[]  NoAmbulation: [x]  Independent	[]  Cane   []  Walker	[]  Wheelchair	[]  Bedbound		[]  Hemiplegic	[]  Paraplegic	[]  QuadraplegicEliminiation: []  Independent	[]  Commode	[]  Bedpan/Urinal  []  Straight Cath []  Foley cath			[]  Urostomy	[]  Colostomy	Other (specify):Diarrhea/Loose stool : []  1x within 24h  []  2x within 24h  []  3x within 24h  []  None 	C.Diff Order: 	[]  Ordered- needs to be collected             []  Collected-sent to lab             []  Resulted - Negative C.Diff             []  Resulted - Positive C.Diff[]  Not Ordered   []  N/ASkin Alteration: []  Pressure Injury []  Wound []  None []  Skin not assessedDiet: []  Regular/No order placed	[]  NPO		Other (specify):IV Access: []  PIV   []  PICC    []  Port    [] Central line    []  A-line    Other (specify)IVF/GTT Running Upon ED Departure? []  No	    []  Yes (specify):Outstanding Meds/Treatments/Tests:Patient Belongings:Are the belongings documented?          []  No	    []  YesIs someone taking belongings home?   []  No     []  Yes  Who? (specify)                                   ED RN and Contact number/MHB #:

## 2020-03-13 NOTE — Utilization Review (ED)
UM Status: Commercial - IP Renal ColicPlan IncludesLabsCT (Shows Renal Calculus)Pain Management

## 2020-03-13 NOTE — Plan of Care
Plan of Care Overview/ Patient Status    [facility] with stable vital signs and minimal discomfort.treatment plan reviewed, patient aware of NPO after midnight and IV fluids to be started. Patient also aware of need to strain all urine. Reinforcement of teaching provided. Questions encouraged. Rise Patience, RN

## 2020-03-13 NOTE — ED Notes
11:34 AM patient states pain still 9-10 RN will make provider aware

## 2020-03-13 NOTE — H&P
[facility]	 [place] [facility]	Urology H&PPresentation History: CHIEF COMPLAINT:  Right flank painHISTORY OF PRESENT ILLNESS Kenneth Bates is a 54 y.o. male with a PMH of HLD, DM and nephrolithiasis who presented to [facility] ED with right flank pain and [place] demonstrating a 7mm right proximal ureteral stone with mild hydronephrosis. His pain was not able to be controlled in the ED and he was transferred to [facility] for further management.The patient is currently afebrile and stable with Cr 0.8 (baseline 0.8), WBC 11.9, UA with 0-2 WBC/HPF, many RBC/HPF, few bacteria, few epithelial cells, negative for nitrite. He reports his pain has resolved. He has not seen the stone pass. No fever, chills, N/V, flank pain, abdominal pain, dysuria, hematuria or other symptoms. He feels well. The patient has passed 3 kidney stones spontaneously in the past. He has never had surgery for kidney stones and has never seen a urologist. Medical History: MEDICAL HISTORY has a past medical history of Diabetes mellitus (HC Code) (HC CODE) and Hyperlipidemia.SURGICAL HISTORY  has no past surgical history on file.MEDICATIONS No current facility-administered medications on file prior to encounter. Current Outpatient Medications on File Prior to Encounter Medication Sig Dispense Refill ? atorvastatin (LIPITOR) 10 mg tablet Take 1 mg by mouth daily.   ? HYDROcodone-acetaminophen (NORCO) 5-325 mg per tablet Take 1 tablet by mouth every 6 (six) hours as needed. 10 tablet 0 ? metFORMIN (GLUMETZA) 500 mg 24 hr extended release tablet Take by mouth 2 (two) times daily with breakfast and dinner.   ? oxyCODONE-acetaminophen (PERCOCET) 5-325 mg per tablet Take 1 tablet by mouth every 8 (eight) hours as needed (pain). 12 tablet 0 ? tamsulosin (FLOMAX) 0.4 mg 24 hr capsule Take 1 capsule (0.4 mg total) by mouth daily. 30 capsule 0 ALLERGIESCodeineFAMILY HISTORYfamily history is not on file.SOCIAL HISTORY reports that he has never smoked. He does not have any smokeless tobacco history on file. He reports that he does not drink alcohol and does not use drugs.ROSPer HPIObjective: BP 124/83  - Pulse 87  - Temp 98.4 ?F (36.9 ?C) (Oral)  - Resp 18  - Ht 5' 8 (1.727 m)  - Wt 108.9 kg  - SpO2 95%  - BMI 36.49 kg/m? PHYSICAL EXAMGen: NAD, lying comfortably in bedChest: Unlabored on RAAbd: Soft, non-distended, non-tender to palpation, no CVATExt: Warm, well perfusedNeuro: Alert, oriented x3 LABSRecent Labs Lab 12/31/210950 WBC 11.9* HGB 16.4 HCT 47.20 PLT 213  No results for input(s): NEUTROPHILS, LABLYMP, LABEOS, BANDSP in the last 168 hours. Recent Labs Lab 12/31/210950 NA 133* K 4.8 CL 96* CO2 26 BUN 13 CREATININE 0.80 GLU 153* ANIONGAP 11  Recent Labs Lab 12/31/210950 CALCIUM 9.3  Recent Labs Lab 12/31/210950 ALT 34 AST 29 ALKPHOS 69 BILITOT 0.4 BILIDIR <0.2  No results for input(s): PTT, LABPROT, INR in the last 168 hours. MICROLab Results Component Value Date  LABURIN No Growth 05/08/2011 	IMAGINGCT Abdomen Pelvis w IV ContrastResult Date: 12/31/2021Obstructing proximal right ureteral calculus with mild hydroureteronephrosis. These findings were discussed via telephone with PA Arlie Solomons by Dr. Janice Norrie on 03/13/2020 at 11:35 AM. Report Initiated By:  Greggory Brandy, MD Reported And Signed By: Jacinta Shoe, MD  [facility] and Biomedical ImagingAssessment & Plan:  The patient is a 54 y.o. male with a PMH of HLD, DM and nephrolithiasis who presented to [facility] ED with right flank pain and [place] demonstrating a 7mm right proximal ureteral stone with mild hydronephrosis. Pain is significantly improved.- Will monitor patient symptoms. If pain returns, will plan  for cystoscopy, right retrograde pyelogram, right ureteral stent placement, possible right ureteroscopy and laser lithotripsy- Diabetic diet, NPO MN- Strain all urine- Tamsulosin- Pain control- Home meds- SCD/OOB/HSQ Patient was discussed with Dr. Carmel [place], MD12/31/20214:05 PM

## 2020-03-14 ENCOUNTER — Telehealth
Admit: 2020-03-14 | Payer: PRIVATE HEALTH INSURANCE | Attending: Student in an Organized Health Care Education/Training Program | Primary: Internal Medicine

## 2020-03-14 DIAGNOSIS — E119 Type 2 diabetes mellitus without complications: Secondary | ICD-10-CM

## 2020-03-14 DIAGNOSIS — Z79899 Other long term (current) drug therapy: Secondary | ICD-10-CM

## 2020-03-14 DIAGNOSIS — Z7982 Long term (current) use of aspirin: Secondary | ICD-10-CM

## 2020-03-14 DIAGNOSIS — N132 Hydronephrosis with renal and ureteral calculous obstruction: Secondary | ICD-10-CM

## 2020-03-14 DIAGNOSIS — Z87442 Personal history of urinary calculi: Secondary | ICD-10-CM

## 2020-03-14 DIAGNOSIS — Z23 Encounter for immunization: Secondary | ICD-10-CM

## 2020-03-14 DIAGNOSIS — Z20822 Contact with and (suspected) exposure to covid-19: Secondary | ICD-10-CM

## 2020-03-14 DIAGNOSIS — E785 Hyperlipidemia, unspecified: Secondary | ICD-10-CM

## 2020-03-14 DIAGNOSIS — Z7984 Long term (current) use of oral hypoglycemic drugs: Secondary | ICD-10-CM

## 2020-03-14 DIAGNOSIS — Z885 Allergy status to narcotic agent status: Secondary | ICD-10-CM

## 2020-03-14 LAB — CBC WITH AUTO DIFFERENTIAL
BKR WAM ABSOLUTE IMMATURE GRANULOCYTES.: 0.02 x 1000/ÂµL (ref 0.00–0.30)
BKR WAM ABSOLUTE LYMPHOCYTE COUNT.: 2.18 x 1000/ÂµL (ref 0.60–3.70)
BKR WAM ABSOLUTE NRBC (2 DEC): 0 x 1000/ÂµL (ref 0.00–1.00)
BKR WAM ANALYZER ANC: 5.51 x 1000/ÂµL (ref 2.00–7.60)
BKR WAM BASOPHIL ABSOLUTE COUNT.: 0.03 x 1000/ÂµL (ref 0.00–1.00)
BKR WAM BASOPHILS: 0.4 % (ref 0.0–1.4)
BKR WAM EOSINOPHIL ABSOLUTE COUNT.: 0 x 1000/ÂµL (ref 0.00–1.00)
BKR WAM EOSINOPHILS: 0 % (ref 0.0–5.0)
BKR WAM HEMATOCRIT (2 DEC): 42.2 % (ref 38.50–50.00)
BKR WAM HEMOGLOBIN: 14.1 g/dL (ref 13.2–17.1)
BKR WAM IMMATURE GRANULOCYTES: 0.2 % (ref 0.0–1.0)
BKR WAM LYMPHOCYTES: 26.1 % (ref 17.0–50.0)
BKR WAM MCH (PG): 30.2 pg (ref 27.0–33.0)
BKR WAM MCHC: 33.4 g/dL (ref 31.0–36.0)
BKR WAM MCV: 90.4 fL (ref 80.0–100.0)
BKR WAM MONOCYTE ABSOLUTE COUNT.: 0.61 x 1000/ÂµL (ref 0.00–1.00)
BKR WAM MONOCYTES: 7.3 % (ref 4.0–12.0)
BKR WAM MPV: 10.4 fL (ref 8.0–12.0)
BKR WAM NEUTROPHILS: 66 % (ref 39.0–72.0)
BKR WAM NUCLEATED RED BLOOD CELLS: 0 % (ref 0.0–1.0)
BKR WAM PLATELETS: 187 x1000/ÂµL (ref 150–420)
BKR WAM RDW-CV: 12.4 % (ref 11.0–15.0)
BKR WAM RED BLOOD CELL COUNT.: 4.67 M/ÂµL (ref 4.00–6.00)
BKR WAM WHITE BLOOD CELL COUNT: 8.4 x1000/ÂµL (ref 4.0–11.0)

## 2020-03-14 LAB — BASIC METABOLIC PANEL
BKR ANION GAP: 10 (ref 7–17)
BKR BLOOD UREA NITROGEN: 11 mg/dL (ref 6–20)
BKR BUN / CREAT RATIO: 12.2 (ref 8.0–23.0)
BKR CALCIUM: 8.8 mg/dL (ref 8.8–10.2)
BKR CHLORIDE: 104 mmol/L (ref 98–107)
BKR CO2: 25 mmol/L (ref 20–30)
BKR CREATININE: 0.9 mg/dL (ref 0.40–1.30)
BKR EGFR (AFR AMER): >60 mL/min/{1.73_m2} (ref >60)
BKR EGFR (NON AFRICAN AMERICAN): >60 mL/min/{1.73_m2} (ref >60)
BKR GLUCOSE: 137 mg/dL — ABNORMAL HIGH (ref 70–100)
BKR POTASSIUM: 4.3 mmol/L (ref 3.3–5.3)
BKR SODIUM: 139 mmol/L (ref 136–144)

## 2020-03-14 LAB — UA REFLEX CULTURE

## 2020-03-14 MED ORDER — ACETAMINOPHEN 500 MG TABLET
500 mg | Freq: Four times a day (QID) | ORAL | Status: AC | PRN
Start: 2020-03-14 — End: ?

## 2020-03-14 MED ORDER — OXYCODONE IMMEDIATE RELEASE 5 MG TABLET
5 mg | ORAL_TABLET | Freq: Four times a day (QID) | ORAL | 1 refills | Status: AC | PRN
Start: 2020-03-14 — End: ?

## 2020-03-14 MED ORDER — TAMSULOSIN 0.4 MG CAPSULE
0.4 mg | ORAL_CAPSULE | Freq: Every evening | ORAL | 1 refills | Status: SS
Start: 2020-03-14 — End: 2020-05-25

## 2020-03-14 MED ORDER — IBUPROFEN 800 MG TABLET
800 mg | ORAL_TABLET | Freq: Three times a day (TID) | ORAL | 1 refills | Status: AC | PRN
Start: 2020-03-14 — End: ?

## 2020-03-14 MED ORDER — CHOLECALCIFEROL (VITAMIN D3) 25 MCG (1,000 UNIT) TABLET
25 mcg (1,000 unit) | Freq: Every day | ORAL | Status: AC
Start: 2020-03-14 — End: ?

## 2020-03-14 MED ORDER — ACETAMINOPHEN 500 MG TABLET
500 mg | ORAL_TABLET | Freq: Four times a day (QID) | ORAL | 1 refills | Status: AC | PRN
Start: 2020-03-14 — End: ?

## 2020-03-14 MED ORDER — CYANOCOBALAMIN (VIT B-12) 1,000 MCG TABLET
1000 MCG | Freq: Every day | ORAL | Status: AC
Start: 2020-03-14 — End: ?

## 2020-03-14 MED ORDER — ASPIRIN 81 MG TABLET,DELAYED RELEASE
81 mg | Freq: Every evening | ORAL | Status: AC
Start: 2020-03-14 — End: ?

## 2020-03-14 MED ORDER — IBUPROFEN 200 MG TABLET
200 mg | Freq: Four times a day (QID) | ORAL | Status: SS | PRN
Start: 2020-03-14 — End: 2020-03-16

## 2020-03-14 NOTE — Progress Notes
[facility]	 [facility]	Urology Progress NoteAttending Provider: Maia Petties., MD Admit Date: 12/31/2021Code status: Full Code/ACLSAllergy: CodeineSubjective: Interim History: - NAEON. - Patient reports no pain or urinary symptoms since admission- Has not seen stone pass although voided without straining urine before and when he first arrived. - Would like to avoid a procedure given he is not experiencing any symptomsObjective: Vitals:Temp:  [97.1 ?F (36.2 ?C)-98.4 ?F (36.9 ?C)] 97.3 ?F (36.3 ?C)Pulse:  [76-87] 79Resp:  [17-18] 18BP: (111-149)/(73-100) 124/75SpO2:  [94 %-98 %] 94 %I/O's:I/O last 3 completed shifts:In: 3700 [P.O.:600; I.V.:3000; IV Piggyback:100]Out: 1550 [Urine:1550]Physical Exam:Gen:NAD, lying comfortably in bedChest: Breathing comfortably on RAAbd: Soft, NT, NDGu: No CVATExt: Warm, well perfusedNeuro: Alert, oriented x3Labs:Lab Results Component Value Date  WBC 8.4 03/14/2020  HGB 14.1 03/14/2020  HCT 42.20 03/14/2020  MCV 90.4 03/14/2020  PLT 187 03/14/2020 Lab Results Component Value Date  CREATININE 0.90 03/14/2020  BUN 11 03/14/2020  NA 139 03/14/2020  K 4.3 03/14/2020  CL 104 03/14/2020  CO2 25 03/14/2020 Micro:Lab Results Component Value Date  LABURIN No Growth 05/08/2011 	Imaging:[place] Abdomen Pelvis w IV ContrastResult Date: 12/31/2021Obstructing proximal right ureteral calculus with mild hydroureteronephrosis. These findings were discussed via telephone with PA Arlie Solomons by Dr. Janice Norrie on 03/13/2020 at 11:35 AM. Report Initiated By:  Greggory Brandy, MD Reported And Signed By: Jacinta Shoe, MD  [place] [facility] Biomedical ImagingAssessment: Lysle Yero is a 55 y.o. male with a PMH of HLD, DM and nephrolithiasis who presented to [facility] ED with right flank pain and [place] demonstrating a 7mm right proximal ureteral stone with mild hydronephrosis. No pain since admission.Plan: - Patient has not experienced any pain since admission. Discussed risks and benefits of management options and patient would like to be discharged home with plan for follow up and imaging. - Diabetic diet- Strain all urine- Tamsulosin- Home meds- SCD/OOBSigned:[place] Cyndie Chime, MD1/1/20228:50 AM

## 2020-03-14 NOTE — Telephone Encounter
Please call the patient to schedule an in person follow up appointment with Dr. Daphine Deutscher in 2-3 weeks with renal US completed prior to the appointment. The renal US has been ordered.

## 2020-03-14 NOTE — Plan of Care
Problem: Adult Inpatient Plan of CareGoal: Plan of Care ReviewOutcome: Interventions implemented as appropriate Plan of Care Overview/ Patient Status    03/14/2020 3:42 AM (7p-7a):Denies pain. Voiding good amounts of clear yellow urine, strained no sediment or stones noted. Afebrile. NPO at midnight, IVF initiated. OOB ad lib safely. Evening dose of Flomax cancelled as patient admitted to taking that morning at home, T.Rank made aware. Remains safe, call bell in reach. Will continue to monitor. Emi Belfast, RN

## 2020-03-14 NOTE — Discharge Summary
[facility]	Urology Discharge SummaryPatient Data:  Patient Name: Kenneth Bates Age: 55 y.o. DOB: [birth year]	 MRN: [id number]	 Admit date: 12/31/2021Discharge date: 1/1/2022Discharge Attending Physician: Kenneth Petties., MD  PCP: Norton Blizzard D.S., MDPrincipal Diagnosis: Right ureteral stoneDischarged Condition: GoodDisposition: HomeAllergies Allergies Allergen Reactions ? Codeine [employer] [facility] Course: The patient is a 55 y.o. male with a PMH of HLD, DM and nephrolithiasis who presented to [facility] ED on 03/13/20 with right flank pain and [place] demonstrating a 7mm right proximal ureteral stone with mild hydronephrosis. His pain was not able to be controlled in the ED and he was transferred to [facility] for further management.Upon prior to arrival at [facility], the patient's pain resolved and did not return throughout his admission. The patient did not see the stone pass although he voided a few times without straining his urine. Given lack of symptoms, the patient elected for outpatient monitoring with urology follow up. There were no complications during his hospital stay.He is to follow up with Kenneth Petties., MD within 2-3 weeks with repeat US. The remaining details of the discharge instructions have been entered electronically.Pertinent Procedures:NoneDischarge vitals: Blood pressure 124/75, pulse 79, temperature 97.3 ?F (36.3 ?C), temperature source Oral, resp. rate 18, height 5' 8 (1.727 m), weight 108 kg, SpO2 94 %.Discharge Physical Exam:Gen:NAD, lying comfortably in bedChest: Breathing comfortably on RAAbd: Soft, NT, NDGu: No CVATExt: Warm, well perfusedNeuro: Alert, oriented x3Pending Items at Discharge:1. Follow up with Kenneth Petties., MD2. Renal US prior to follow up appointmentDischarge Medications: Current Discharge Medication List  START taking these medications  Details !! acetaminophen (TYLENOL) 500 mg tablet Take 2 tablets (1,000 mg total) by mouth every 6 (six) hours as needed (pain) for up to 10 days.Qty: 30 tablet, Refills: 0Start date: 03/14/2020, End date: 03/24/2020  !! ibuprofen (ADVIL,MOTRIN) 800 mg tablet Take 1 tablet (800 mg total) by mouth every 8 (eight) hours as needed.Qty: 21 tablet, Refills: 0Start date: 03/14/2020  oxyCODONE (ROXICODONE) 5 mg Immediate Release tablet Take 1 tablet (5 mg total) by mouth every 6 (six) hours as needed (pain).Qty: 5 tablet, Refills: 0Start date: 03/14/2020  !! tamsulosin (FLOMAX) 0.4 mg 24 hr capsule Take 1 capsule (0.4 mg total) by mouth nightly.Qty: 30 capsule, Refills: 0Start date: 03/14/2020   !! - Potential duplicate medications found. Please discuss with provider.  CONTINUE these medications which have NOT CHANGED  Details !! acetaminophen (TYLENOL) 500 mg tablet Take 1,000 mg by mouth every 6 (six) hours as needed.  aspirin 81 mg EC delayed release tablet Take 81 mg by mouth every evening.  atorvastatin (LIPITOR) 10 mg tablet Take 10 mg by mouth every evening.   cholecalciferol, vitamin D3, 25 mcg (1,000 unit) tablet Take 1,000 Units by mouth daily.  cyanocobalamin 1000 MCG tablet Take 1,000 mcg by mouth daily.  !! ibuprofen (ADVIL,MOTRIN) 200 mg tablet Take 400 mg by mouth every 6 (six) hours as needed.  metFORMIN (GLUCOPHAGE) 500 mg Immediate Release tablet Take 500 mg by mouth 2 (two) times daily with breakfast and dinner.   oxyCODONE-acetaminophen (PERCOCET) 5-325 mg per tablet Take 1 tablet by mouth every 8 (eight) hours as needed (pain).Qty: 12 tablet, Refills: 0  !! tamsulosin (FLOMAX) 0.4 mg 24 hr capsule Take 1 capsule (0.4 mg total) by mouth daily.Qty: 30 capsule, Refills: 0   !! - Potential duplicate medications found. Please discuss with provider.  STOP taking these medications   HYDROcodone-acetaminophen (NORCO) 5-325 mg per tablet Follow-up Information:As discussed with patientMartin, Bary Castilla., [phone] [address]Fl  [address] [phone] will be called to schedule a follow up appointment PMH PSH Past Medical History: Diagnosis Date ? Diabetes mellitus (HC Code) (HC CODE)  ? Hyperlipidemia   No past surgical history on file. Social History Family History Social History Tobacco Use ? Smoking status: Never Smoker Substance Use Topics ? Alcohol use: No  History reviewed. No pertinent family history. Electronically Signed:[place] Cyndie Chime, MD 03/14/2020 9:06 AMAddendum : [facility]

## 2020-03-14 NOTE — Discharge Instructions
Medications:- You have been prescribed tamsulosin. This medication can help pass kidney stones and relieve pain from renal colic. Please take as prescribed.- You have been prescribed tylenol and ibuprofen for pain. You may alternate these medications every 3 hours for maximal pain control. Please take as prescribed.- You have been prescribed an opiate pain medication (oxycodone) which was sent directly to your pharmacy. You may take oxycodone for break through pain that is not managed with tylenol and ibuprofen.- Be sure to take over the counter stool softeners (senna, colace, and miralax) as directed to soften stool while on narcotic pain medication. If you experience loose or liquid stool stop these medications.Diet: - Regular as tolerated- Eat small frequent meals, especially when taking narcotic pain medicine as it can cause nausea on an empty stomach. Activity Instructions- Please continue to strain your urine. If you see a kidney stone pass, please keep it and bring it to your urology appointment. The stone can be sent for analysis, which may help guide future therapy.- No driving or operating heavy machinery while taking narcotics pain medications.Follow up:- You will be called to schedule a follow up appointment with a urologist. You will also be called to schedule a renal ultrasound, which should be obtained prior to your appointment.Special Instructions:- Call MD or go to the ED if fever greater than 101.4, shaking chills, intractable nausea/vomiting, chest pain, shortness of breath, or pain not controlled with oral pain medications.If you have any questions in between the time you are discharged and the date of your next appointment, you should contact: Urology, [phone]***

## 2020-03-14 NOTE — Plan of Care
Plan of Care Overview/ Patient Status    Patient alert/ oriented x 4, independent in room, being discharged home today.  POC reviewed- patient states understanding of teaching.  Voiding without difficulty, no c/o flank pain.  Urine yellow, clear, strained with no stone or sediment.  Tolerating diet, no n/v.  Afebrile, VSS.  Call bell in reach, safety maintained at all times, will cont to monitor.

## 2020-03-14 NOTE — ED Provider Notes
Emergency Department -------------------------------------------------------------------------------------------------ZOX:WRUEAVW is a 55 y/o male with PMHx of DM (on Metformin), HL, and hx of kidney stones in the past who presents to the ED with acute R-flank pain.  Patient was seen in the ED on 12/6 and 12/8 for LEFT sided flank pain.  At that time, he had a [place] which showed a 6 mm objecting stone in the distal L ureter.  He was also noted to have stones in the RIGHT kidney.  His managed was managed and he was d/c home with Flomax.  He states he continued to have pain for about a week but it eventually resolved.  He stopped the Flomax.  He states this morning around 6 AM he developed acute onset of Right sided flank pain radiating to the R side of his abdomen.  He states the pain has been constant, waxing/waning severity.  He denies hematuria, dysuria, fever, chills, nausea/vomiting.  He reports taking Flomax just prior to arrival.  He reports hx of kidney stones but he has never had urologic intervention and does not have an established urologist.  -------------------------------------------------------------------------------------------------HistoryChief Complaint Patient presents with ? Flank Pain   R sided flank pain since 6am. Hx of kidney stone. Pt has not urinated yet today. Took a flomax at home PTA  The history is provided by the patient and medical records. Flank PainThis is a recurrent problem. The current episode started 1 to 2 hours ago. The problem occurs constantly. The problem has not changed since onset.Associated symptoms include abdominal pain. Pertinent negatives include no chest pain, no headaches and no shortness of breath. Nothing aggravates the symptoms. Nothing relieves the symptoms. He has tried nothing for the symptoms.  Past Medical History: Diagnosis Date ? Diabetes mellitus (HC Code) (HC CODE)  ? Hyperlipidemia  No past surgical history on file.History reviewed. No pertinent family history.Social History Socioeconomic History ? Marital status: Married   Spouse name: Not on file ? Number of children: Not on file ? Years of education: Not on file ? Highest education level: Not on file Tobacco Use ? Smoking status: Never Smoker Substance and Sexual Activity ? Alcohol use: No ? Drug use: No Social History Narrative  ** Merged History Encounter **    ED Other Social History E-cigarette/Vaping Substances E-cigarette/Vaping Devices Review of Systems Constitutional: Negative for chills, fatigue and fever. Respiratory: Negative for shortness of breath.  Cardiovascular: Negative for chest pain. Gastrointestinal: Positive for abdominal pain. Negative for nausea and vomiting. Genitourinary: Positive for flank pain. Negative for dysuria. Neurological: Negative for dizziness and headaches. All other systems reviewed and are negative. Physical ExamED Triage Vitals [03/13/20 0941]BP: (!) 147/98Pulse: 76Pulse from  O2 sat: n/aResp: 18Temp: 97.1 ?F (36.2 ?C)Temp src: n/aSpO2: 98 % BP 119/82  - Pulse 69  - Temp 98.1 ?F (36.7 ?C) (Oral)  - Resp 18  - Ht 5' 8 (1.727 m)  - Wt 108 kg  - SpO2 (!) 92%  - BMI 36.20 kg/m? Physical ExamVitals reviewed. Constitutional:     General: He is not in acute distress.   Appearance: Normal appearance. He is not ill-appearing or toxic-appearing. HENT:    Head: Normocephalic and atraumatic.    Nose: Nose normal.    Mouth/Throat:    Mouth: Mucous membranes are moist. Eyes:    Pupils: Pupils are equal, round, and reactive to light. Cardiovascular:    Rate and Rhythm: Normal rate and regular rhythm.    Pulses: Normal pulses. Pulmonary:    Effort: Pulmonary effort is normal.  Breath sounds: No wheezing. Abdominal:    Palpations: Abdomen is soft.    Tenderness: There is abdominal tenderness. There is right CVA tenderness. There is no guarding. Musculoskeletal:       General: Normal range of motion.    Cervical back: Normal range of motion and neck supple. Skin:   General: Skin is warm.    Capillary Refill: Capillary refill takes less than 2 seconds. Neurological:    General: No focal deficit present.    Mental Status: He is alert and oriented to person, place, and time. Psychiatric:       Mood and Affect: Mood normal.       Behavior: Behavior normal.       Thought Content: Thought content normal.  ProceduresProceduresResident/APP MDM:------------------------------------------------------------------------------------------------------------------------------Summary:Patient is a 55 y/o male with PMHx of DM (on Metformin), HL, and hx of kidney stones in the past who presents to the ED with acute R-flank pain.  Patient was seen in the ED on 12/6 and 12/8 for LEFT sided flank pain.  At that time, he had a [place] which showed a 6 mm objecting stone in the distal L ureter.  He was also noted to have stones in the RIGHT kidney.  His managed was managed and he was d/c home with Flomax.  He states he continued to have pain for about a week but it eventually resolved.  He stopped the Flomax.  He states this morning around 6 AM he developed acute onset of Right sided flank pain radiating to the R side of his abdomen.  He states the pain has been constant, waxing/waning severity.  He denies hematuria, dysuria, fever, chills, nausea/vomiting.  He reports taking Flomax just prior to arrival.  He reports hx of kidney stones but he has never had urologic intervention and does not have an established urologist.  Differential diagnosis: Renal colic, biliary colic, pancreatitis, appendicitis, UTI, infected stone.No CP/SOB to suggest PE or pulmonary etiology Plan:  -  UA-  Labs-  IV fluids, pain control-  Consider [place]   Results:-  UA large - many RBCs-  COVID-  WBC 11.9-  H/H 16.4/47.2-  Platelets 213-  Cr 0.8-  Na 133-  Cl 96-  Gluc 153-  Normal LFTs-  Lipase 245-  [place] abd/pelvis:  There is a 7 mm stone in the right proximal ureter with mild upstream dilatation of the collecting system associated with new proximal periureteral and perinephric fat stranding. Additional nonobstructing right renal stones are noted measuring up to 6 mm. The previously seen left distal ureteral stone is no longer present, with interval resolution of left perinephric fat stranding and collecting system dilatation.Assessment/Plan/Disposition:    55 y/o male with PMHx of DM (on Metformin), HL, and hx of kidney stones in the past who presented to the ED with acute R-flank pain.  Patient found to have a 7 mm stone in the R-prox ureter.  UA + blood  But no evidence of infection.  Difficulty with pain control.  Patient admitted to urology for further intervention.  Discussed patient with Dr Azucena Kuba ED attending urology resident at [facility].  Patient agreeable to plan.  Arlie Solomons, PA-C-------------------------------------------------------------------------------------------------ED COURSEPatient Reevaluation: ED Attestation: PA/APRNFace to face evaluation was performed by me in collaboration with the Advanced Practice Provider to assess for significant health threats. I provided a substantive portion of the care of this patient.? I personally performed the History, Exam and MDM. 43M  c hx ureteral stones now p/w right flank pain. Most recent prior stones were  left sided. Feels similar to prior stones.On my exam: BP 119/82  - Pulse 69  - Temp 98.1 ?F (36.7 ?C) (Oral)  - Resp 18  - Ht 5' 8 (1.727 m)  - Wt 108 kg  - SpO2 (!) 92%  - BMI 36.20 kg/m? Awake and alert no distress. No CVAT. Soft belly, non tender, non peritoneall.My differential includes: ureteral stoneUA w blood no infectionCT w 7 mm right prox stoneDifficulty controlling pain despite IV morphine, IV lidocaine, toradolUrology admitEleanor Anderson ReidClinical Impressions as of Mar 14 1334 Renal colic  ED DispositionAdmit Arlie Solomons, PA12/31/21 1502 Army Fossa, MD01/01/22 1336

## 2020-03-15 ENCOUNTER — Inpatient Hospital Stay: Admit: 2020-03-15 | Discharge: 2020-03-16 | Payer: PRIVATE HEALTH INSURANCE | Primary: Internal Medicine

## 2020-03-15 ENCOUNTER — Encounter: Admit: 2020-03-15 | Payer: PRIVATE HEALTH INSURANCE | Attending: Urology | Primary: Internal Medicine

## 2020-03-15 ENCOUNTER — Telehealth
Admit: 2020-03-15 | Payer: PRIVATE HEALTH INSURANCE | Attending: Student in an Organized Health Care Education/Training Program | Primary: Internal Medicine

## 2020-03-15 DIAGNOSIS — N201 Calculus of ureter: Secondary | ICD-10-CM

## 2020-03-15 MED ORDER — PHENAZOPYRIDINE 100 MG TABLET
100 mg | Freq: Three times a day (TID) | ORAL | Status: DC | PRN
Start: 2020-03-15 — End: 2020-03-17

## 2020-03-15 MED ORDER — KETOROLAC 30 MG/ML (1 ML) INJECTION SOLUTION
301 mg/mL (1 mL) | Freq: Once | INTRAVENOUS | Status: CP
Start: 2020-03-15 — End: ?
  Administered 2020-03-16: 05:00:00 30 mL via INTRAVENOUS

## 2020-03-15 MED ORDER — DEXTROSE 50 % IN WATER (D50W) INTRAVENOUS SYRINGE
INTRAVENOUS | Status: DC | PRN
Start: 2020-03-15 — End: 2020-03-17

## 2020-03-15 MED ORDER — GLUCAGON 1 MG/ML IN STERILE WATER
Freq: Once | INTRAMUSCULAR | Status: DC | PRN
Start: 2020-03-15 — End: 2020-03-17

## 2020-03-15 MED ORDER — DEXTROSE 40 % ORAL GEL
40 % | ORAL | Status: DC | PRN
Start: 2020-03-15 — End: 2020-03-17

## 2020-03-15 MED ORDER — FRUIT JUICE
ORAL | Status: DC | PRN
Start: 2020-03-15 — End: 2020-03-17

## 2020-03-15 MED ORDER — INSULIN U-100 REGULAR HUMAN 100 UNIT/ML (SLIDING SCALE)
100 unit/mL | Freq: Four times a day (QID) | SUBCUTANEOUS | Status: DC
Start: 2020-03-15 — End: 2020-03-17

## 2020-03-15 MED ORDER — SODIUM CHLORIDE 0.9 % (FLUSH) INJECTION SYRINGE
0.9 % | INTRAVENOUS | Status: DC | PRN
Start: 2020-03-15 — End: 2020-03-17

## 2020-03-15 MED ORDER — TAMSULOSIN 0.4 MG CAPSULE
0.4 mg | Freq: Every evening | ORAL | Status: DC
Start: 2020-03-15 — End: 2020-03-16

## 2020-03-15 MED ORDER — SKIM MILK
ORAL | Status: DC | PRN
Start: 2020-03-15 — End: 2020-03-17

## 2020-03-15 MED ORDER — OXYCODONE IMMEDIATE RELEASE 5 MG TABLET
5 mg | ORAL | Status: DC | PRN
Start: 2020-03-15 — End: 2020-03-17

## 2020-03-15 MED ORDER — ASPIRIN 81 MG TABLET,DELAYED RELEASE
81 mg | Freq: Every day | ORAL | Status: DC
Start: 2020-03-15 — End: 2020-03-17

## 2020-03-15 MED ORDER — OXYCODONE IMMEDIATE RELEASE 5 MG TABLET
5 mg | ORAL_TABLET | Freq: Four times a day (QID) | ORAL | 1 refills | Status: AC | PRN
Start: 2020-03-15 — End: ?

## 2020-03-15 MED ORDER — SODIUM CHLORIDE 0.9 % (FLUSH) INJECTION SYRINGE
0.9 % | Freq: Three times a day (TID) | INTRAVENOUS | Status: DC
Start: 2020-03-15 — End: 2020-03-17

## 2020-03-15 MED ORDER — MORPHINE 2 MG/ML INJECTION SYRINGE
2 mg/mL | INTRAVENOUS | Status: DC | PRN
Start: 2020-03-15 — End: 2020-03-17
  Administered 2020-03-16 (×3): 2 mL via INTRAVENOUS

## 2020-03-15 MED ORDER — SODIUM CHLORIDE 0.9 % INTRAVENOUS SOLUTION
INTRAVENOUS | Status: DC
Start: 2020-03-15 — End: 2020-03-16
  Administered 2020-03-16: 06:00:00 via INTRAVENOUS

## 2020-03-15 MED ORDER — TAMSULOSIN 0.4 MG CAPSULE
0.4 mg | Freq: Every evening | ORAL | Status: DC
Start: 2020-03-15 — End: 2020-03-17

## 2020-03-15 MED ORDER — ACETAMINOPHEN 325 MG TABLET
325 mg | Freq: Four times a day (QID) | ORAL | Status: DC
Start: 2020-03-15 — End: 2020-03-17
  Administered 2020-03-16 (×2): 325 mg via ORAL

## 2020-03-15 MED ORDER — ONDANSETRON 4 MG DISINTEGRATING TABLET
4 mg | Freq: Four times a day (QID) | ORAL | Status: DC | PRN
Start: 2020-03-15 — End: 2020-03-17

## 2020-03-15 MED ORDER — OXYCODONE IMMEDIATE RELEASE 5 MG TABLET
5 mg | ORAL | Status: DC | PRN
Start: 2020-03-15 — End: 2020-03-17
  Administered 2020-03-16: 23:00:00 5 mg via ORAL

## 2020-03-15 MED ORDER — LACTATED RINGERS IV BOLUS (NEW BAG)
Freq: Once | INTRAVENOUS | Status: CP
Start: 2020-03-15 — End: ?
  Administered 2020-03-16: 05:00:00 1000.000 mL/h via INTRAVENOUS

## 2020-03-15 MED ORDER — ROSUVASTATIN 5 MG TABLET
5 mg | Freq: Every day | ORAL | Status: DC
Start: 2020-03-15 — End: 2020-03-17

## 2020-03-15 NOTE — Telephone Encounter
Patient called to report right flank pain has returned. He has taken ibuprofen and oxycodone and the pain has improved. He reports it is currently manageable. No fever, chills, nausea, vomiting, dysuria or hematuria. The patient would like to have a definite stone procedure scheduled as he is not certain that he would be able to pass the stone.- Recommended alternating tylenol and ibuprofen and using oxycodone for breakthrough pain. Patient was only prescribed 5 oxycodones and has used 3. Will prescribe more to assist with pain control.- Discussed strict ED precautions- Will forward message to Dr. Daphine Deutscher that patient does not want to wait on a follow up appointment and would prefer to be scheduled for surgery in the next few weeks.

## 2020-03-15 NOTE — Telephone Encounter
NicoleI placed CR for this patient to be scheduled somewhat urgentlyCan be given to anyone who has availability in my absence this coming week or I can do week I get back if there is space/timeHe'll need a Ucx ( I ordered  at [facility])) as had not had one when in hospital this weekend.  Other labs should be done from hospital.Tom

## 2020-03-16 ENCOUNTER — Telehealth: Admit: 2020-03-16 | Payer: PRIVATE HEALTH INSURANCE | Attending: Surgical | Primary: Internal Medicine

## 2020-03-16 ENCOUNTER — Inpatient Hospital Stay: Admit: 2020-03-16 | Payer: PRIVATE HEALTH INSURANCE

## 2020-03-16 ENCOUNTER — Inpatient Hospital Stay
Admit: 2020-03-16 | Payer: PRIVATE HEALTH INSURANCE | Attending: Student in an Organized Health Care Education/Training Program

## 2020-03-16 ENCOUNTER — Encounter: Admit: 2020-03-16 | Payer: PRIVATE HEALTH INSURANCE | Attending: Urology | Primary: Internal Medicine

## 2020-03-16 DIAGNOSIS — N200 Calculus of kidney: Secondary | ICD-10-CM

## 2020-03-16 DIAGNOSIS — E785 Hyperlipidemia, unspecified: Secondary | ICD-10-CM

## 2020-03-16 DIAGNOSIS — E119 Type 2 diabetes mellitus without complications: Secondary | ICD-10-CM

## 2020-03-16 DIAGNOSIS — R1011 Right upper quadrant pain: Secondary | ICD-10-CM

## 2020-03-16 LAB — CBC WITH AUTO DIFFERENTIAL
BKR WAM ABSOLUTE IMMATURE GRANULOCYTES.: 0.04 x 1000/ÂµL (ref 0.00–0.30)
BKR WAM ABSOLUTE IMMATURE GRANULOCYTES.: 0.01 x 1000/ÂµL (ref 0.00–0.30)
BKR WAM ABSOLUTE LYMPHOCYTE COUNT.: 2.06 x 1000/ÂµL (ref 0.60–3.70)
BKR WAM ABSOLUTE LYMPHOCYTE COUNT.: 1.97 x 1000/ÂµL (ref 0.60–3.70)
BKR WAM ABSOLUTE NRBC (2 DEC): 0 x 1000/ÂµL (ref 0.00–1.00)
BKR WAM ABSOLUTE NRBC (2 DEC): 0 x 1000/ÂµL (ref 0.00–1.00)
BKR WAM ANALYZER ANC: 6.95 x 1000/ÂµL (ref 2.00–7.60)
BKR WAM ANALYZER ANC: 3.53 x 1000/ÂµL (ref 2.00–7.60)
BKR WAM BASOPHIL ABSOLUTE COUNT.: 0.03 x 1000/ÂµL (ref 0.00–1.00)
BKR WAM BASOPHIL ABSOLUTE COUNT.: 0.02 x 1000/ÂµL (ref 0.00–1.00)
BKR WAM BASOPHILS: 0.3 % (ref 0.0–1.4)
BKR WAM BASOPHILS: 0.3 % (ref 0.0–1.4)
BKR WAM EOSINOPHIL ABSOLUTE COUNT.: 0 x 1000/ÂµL (ref 0.00–1.00)
BKR WAM EOSINOPHIL ABSOLUTE COUNT.: 0 x 1000/ÂµL (ref 0.00–1.00)
BKR WAM EOSINOPHILS: 0 % (ref 0.0–5.0)
BKR WAM EOSINOPHILS: 0 % (ref 0.0–5.0)
BKR WAM HEMATOCRIT (2 DEC): 42.4 % (ref 38.50–50.00)
BKR WAM HEMATOCRIT (2 DEC): 38.4 % — ABNORMAL LOW (ref 38.50–50.00)
BKR WAM HEMOGLOBIN: 14.4 g/dL (ref 13.2–17.1)
BKR WAM HEMOGLOBIN: 13.1 g/dL — ABNORMAL LOW (ref 13.2–17.1)
BKR WAM IMMATURE GRANULOCYTES: 0.4 % (ref 0.0–1.0)
BKR WAM IMMATURE GRANULOCYTES: 0.2 % (ref 0.0–1.0)
BKR WAM LYMPHOCYTES: 21 % (ref 17.0–50.0)
BKR WAM LYMPHOCYTES: 32.9 % (ref 17.0–50.0)
BKR WAM MCH (PG): 31 pg (ref 27.0–33.0)
BKR WAM MCH (PG): 31.2 pg (ref 27.0–33.0)
BKR WAM MCHC: 34 g/dL (ref 31.0–36.0)
BKR WAM MCHC: 34.1 g/dL (ref 31.0–36.0)
BKR WAM MCV: 91.2 fL (ref 80.0–100.0)
BKR WAM MCV: 91.4 fL (ref 80.0–100.0)
BKR WAM MONOCYTE ABSOLUTE COUNT.: 0.74 x 1000/ÂµL (ref 0.00–1.00)
BKR WAM MONOCYTE ABSOLUTE COUNT.: 0.45 x 1000/ÂµL (ref 0.00–1.00)
BKR WAM MONOCYTES: 7.5 % (ref 4.0–12.0)
BKR WAM MONOCYTES: 7.5 % (ref 4.0–12.0)
BKR WAM MPV: 11.2 fL (ref 8.0–12.0)
BKR WAM MPV: 10.4 fL (ref 8.0–12.0)
BKR WAM NEUTROPHILS: 70.8 % (ref 39.0–72.0)
BKR WAM NEUTROPHILS: 59.1 % (ref 39.0–72.0)
BKR WAM NUCLEATED RED BLOOD CELLS: 0 % (ref 0.0–1.0)
BKR WAM NUCLEATED RED BLOOD CELLS: 0 % (ref 0.0–1.0)
BKR WAM PLATELETS: 201 x1000/ÂµL (ref 150–420)
BKR WAM PLATELETS: 172 x1000/ÂµL (ref 150–420)
BKR WAM RDW-CV: 12.4 % (ref 11.0–15.0)
BKR WAM RDW-CV: 12.4 % (ref 11.0–15.0)
BKR WAM RED BLOOD CELL COUNT.: 4.65 M/ÂµL (ref 4.00–6.00)
BKR WAM RED BLOOD CELL COUNT.: 4.2 M/ÂµL (ref 4.00–6.00)
BKR WAM WHITE BLOOD CELL COUNT: 9.8 x1000/ÂµL (ref 4.0–11.0)
BKR WAM WHITE BLOOD CELL COUNT: 6 x1000/ÂµL (ref 4.0–11.0)

## 2020-03-16 LAB — URINALYSIS WITH CULTURE REFLEX      (BH LMW YH)
BKR BILIRUBIN, UA: NEGATIVE
BKR GLUCOSE, UA: NEGATIVE
BKR LEUKOCYTE ESTERASE, UA: NEGATIVE
BKR NITRITE, UA: NEGATIVE
BKR PH, UA: 6 (ref 5.5–7.5)
BKR SPECIFIC GRAVITY, UA: 1.024 (ref 1.005–1.030)
BKR UROBILINOGEN, UA: <2 EU/dL (ref <=2.0)

## 2020-03-16 LAB — BASIC METABOLIC PANEL
BKR ANION GAP: 11 (ref 7–17)
BKR ANION GAP: 8 (ref 7–17)
BKR BLOOD UREA NITROGEN: 12 mg/dL (ref 6–20)
BKR BLOOD UREA NITROGEN: 11 mg/dL (ref 6–20)
BKR BUN / CREAT RATIO: 10.9 (ref 8.0–23.0)
BKR BUN / CREAT RATIO: 12.2 (ref 8.0–23.0)
BKR CALCIUM: 9.3 mg/dL (ref 8.8–10.2)
BKR CALCIUM: 8.6 mg/dL — ABNORMAL LOW (ref 8.8–10.2)
BKR CHLORIDE: 99 mmol/L (ref 98–107)
BKR CHLORIDE: 101 mmol/L (ref 98–107)
BKR CO2: 27 mmol/L (ref 20–30)
BKR CO2: 27 mmol/L (ref 20–30)
BKR CREATININE: 1.1 mg/dL (ref 0.40–1.30)
BKR CREATININE: 0.9 mg/dL (ref 0.40–1.30)
BKR EGFR (AFR AMER): >60 mL/min/{1.73_m2} (ref >60)
BKR EGFR (AFR AMER): >60 mL/min/{1.73_m2} (ref >60)
BKR EGFR (NON AFRICAN AMERICAN): >60 mL/min/{1.73_m2} (ref >60)
BKR EGFR (NON AFRICAN AMERICAN): >60 mL/min/{1.73_m2} (ref >60)
BKR GLUCOSE: 122 mg/dL — ABNORMAL HIGH (ref 70–100)
BKR GLUCOSE: 112 mg/dL — ABNORMAL HIGH (ref 70–100)
BKR POTASSIUM: 3.9 mmol/L (ref 3.3–5.3)
BKR POTASSIUM: 3.7 mmol/L (ref 3.3–5.3)
BKR SODIUM: 137 mmol/L (ref 136–144)
BKR SODIUM: 136 mmol/L (ref 136–144)

## 2020-03-16 LAB — SARS COV-2 (COVID-19) RNA: BKR SARS-COV-2 RNA (COVID-19) (YH): NEGATIVE

## 2020-03-16 LAB — URINE MICROSCOPIC     (BH GH LMW YH)
BKR HYALINE CASTS, UA INSTRUMENT (NUMERIC): 3 /LPF (ref 0–3)
BKR RBC/HPF INSTRUMENT: 26 /HPF — ABNORMAL HIGH (ref 0–2)
BKR WBC/HPF INSTRUMENT: 2 /HPF (ref 0–5)

## 2020-03-16 LAB — UA REFLEX CULTURE

## 2020-03-16 MED ORDER — CEFAZOLIN 1 GRAM SOLUTION FOR INJECTION
1 gram | INTRAVENOUS | Status: DC | PRN
Start: 2020-03-16 — End: 2020-03-16
  Administered 2020-03-16: 18:00:00 1 gram via INTRAVENOUS

## 2020-03-16 MED ORDER — HYDROMORPHONE 0.5 MG/0.5 ML INJECTION SYRINGE
0.50.5 mg/ mL | INTRAVENOUS | Status: DC | PRN
Start: 2020-03-16 — End: 2020-03-16

## 2020-03-16 MED ORDER — SODIUM CHLORIDE 0.9 % (FLUSH) INJECTION SYRINGE
0.9 % | Freq: Three times a day (TID) | INTRAVENOUS | Status: DC
Start: 2020-03-16 — End: 2020-03-16
  Administered 2020-03-16: 08:00:00 0.9 mL via INTRAVENOUS

## 2020-03-16 MED ORDER — LACTATED RINGERS INTRAVENOUS SOLUTION
INTRAVENOUS | Status: DC | PRN
Start: 2020-03-16 — End: 2020-03-16
  Administered 2020-03-16: 18:00:00 via INTRAVENOUS

## 2020-03-16 MED ORDER — PHENAZOPYRIDINE 200 MG TABLET
200 mg | ORAL_TABLET | Freq: Three times a day (TID) | ORAL | 1 refills | Status: AC | PRN
Start: 2020-03-16 — End: 2020-04-07

## 2020-03-16 MED ORDER — FENTANYL (PF) 50 MCG/ML INJECTION SOLUTION
50 mcg/mL | Status: CP
Start: 2020-03-16 — End: ?

## 2020-03-16 MED ORDER — MIDAZOLAM (PF) 1 MG/ML INJECTION SOLUTION
1 mg/mL | INTRAVENOUS | Status: DC | PRN
Start: 2020-03-16 — End: 2020-03-16
  Administered 2020-03-16: 18:00:00 1 mg/mL via INTRAVENOUS

## 2020-03-16 MED ORDER — OXYCODONE (ROXICODONE) IMMEDIATE RELEASE 2.5 MG HALFTAB
2.5 mg | ORAL | Status: DC | PRN
Start: 2020-03-16 — End: 2020-03-16

## 2020-03-16 MED ORDER — MIDAZOLAM (PF) 1 MG/ML INJECTION SOLUTION
1 mg/mL | Status: CP
Start: 2020-03-16 — End: ?

## 2020-03-16 MED ORDER — ONDANSETRON HCL (PF) 4 MG/2 ML INJECTION SOLUTION
4 mg/2 mL | INTRAVENOUS | Status: DC | PRN
Start: 2020-03-16 — End: 2020-03-16
  Administered 2020-03-16: 18:00:00 4 mg/2 mL via INTRAVENOUS

## 2020-03-16 MED ORDER — FENTANYL (PF) 50 MCG/ML INJECTION SOLUTION
50 mcg/mL | INTRAVENOUS | Status: DC | PRN
Start: 2020-03-16 — End: 2020-03-16
  Administered 2020-03-16 (×2): 50 mcg/mL via INTRAVENOUS

## 2020-03-16 MED ORDER — LIDOCAINE (PF) 20 MG/ML (2 %) INTRAVENOUS SOLUTION
20 mg/mL (2 %) | INTRAVENOUS | Status: DC | PRN
Start: 2020-03-16 — End: 2020-03-16
  Administered 2020-03-16: 18:00:00 20 mg/mL (2 %) via INTRAVENOUS

## 2020-03-16 MED ORDER — ONDANSETRON HCL (PF) 4 MG/2 ML INJECTION SOLUTION
42 mg/2 mL | INTRAVENOUS | Status: DC | PRN
Start: 2020-03-16 — End: 2020-03-16

## 2020-03-16 MED ORDER — MEPERIDINE 25 MG/2.5 ML IN 0.9% SODIUM CHLORIDE
INTRAVENOUS | Status: DC | PRN
Start: 2020-03-16 — End: 2020-03-16

## 2020-03-16 MED ORDER — SODIUM CHLORIDE 0.9 % IRRIGATION SOLUTION
0.9 % irrigation | Status: CP | PRN
Start: 2020-03-16 — End: ?
  Administered 2020-03-16: 18:00:00 0.9 % irrigation

## 2020-03-16 MED ORDER — DIPHENHYDRAMINE 50 MG/ML INJECTION SOLUTION
50 mg/mL | INTRAVENOUS | Status: DC | PRN
Start: 2020-03-16 — End: 2020-03-16

## 2020-03-16 MED ORDER — PROPOFOL 10 MG/ML INTRAVENOUS EMULSION
10 mg/mL | INTRAVENOUS | Status: DC | PRN
Start: 2020-03-16 — End: 2020-03-16
  Administered 2020-03-16: 18:00:00 10 mg/mL via INTRAVENOUS

## 2020-03-16 MED ORDER — LORAZEPAM 2 MG/ML INJECTION SOLUTION
2 mg/mL | Freq: Once | INTRAVENOUS | Status: DC | PRN
Start: 2020-03-16 — End: 2020-03-16

## 2020-03-16 MED ORDER — FENTANYL (PF) 50 MCG/ML INJECTION SOLUTION
50 mcg/mL | INTRAVENOUS | Status: DC | PRN
Start: 2020-03-16 — End: 2020-03-16

## 2020-03-16 MED ORDER — CEFUROXIME AXETIL 500 MG TABLET
500 mg | ORAL_TABLET | Freq: Two times a day (BID) | ORAL | 1 refills | Status: AC
Start: 2020-03-16 — End: ?

## 2020-03-16 MED ORDER — SODIUM CHLORIDE 0.9 % (FLUSH) INJECTION SYRINGE
0.9 % | INTRAVENOUS | Status: DC | PRN
Start: 2020-03-16 — End: 2020-03-16

## 2020-03-16 MED ORDER — OXYCODONE IMMEDIATE RELEASE 5 MG TABLET
5 mg | ORAL | Status: DC | PRN
Start: 2020-03-16 — End: 2020-03-16

## 2020-03-16 MED ORDER — NALOXONE 0.4 MG/ML INJECTION SOLUTION
0.4 mg/mL | INTRAVENOUS | Status: DC | PRN
Start: 2020-03-16 — End: 2020-03-16

## 2020-03-16 MED ORDER — ACETAMINOPHEN 325 MG TABLET
325 mg | ORAL | Status: DC | PRN
Start: 2020-03-16 — End: 2020-03-16

## 2020-03-16 MED ORDER — CHLORHEXIDINE GLUCONATE 0.12 % MOUTHWASH
0.12 % | Freq: Once | OROMUCOSAL | Status: CP
Start: 2020-03-16 — End: ?
  Administered 2020-03-16: 12:00:00 0.12 mL via OROMUCOSAL

## 2020-03-16 MED ORDER — LACTATED RINGERS INTRAVENOUS SOLUTION
INTRAVENOUS | Status: DC
Start: 2020-03-16 — End: 2020-03-17
  Administered 2020-03-16: 08:00:00 1000.000 mL/h via INTRAVENOUS

## 2020-03-16 MED ORDER — PHENAZOPYRIDINE 100 MG TABLET
100 mg | Freq: Once | ORAL | Status: CP
Start: 2020-03-16 — End: ?
  Administered 2020-03-16: 20:00:00 100 mg via ORAL

## 2020-03-16 MED ORDER — DROPERIDOL 2.5 MG/ML INJECTION SOLUTION
2.5 mg/mL | INTRAVENOUS | Status: DC | PRN
Start: 2020-03-16 — End: 2020-03-16

## 2020-03-16 MED ORDER — PROPOFOL 10 MG/ML INTRAVENOUS EMULSION
10 mg/mL | INTRAVENOUS | Status: DC | PRN
Start: 2020-03-16 — End: 2020-03-16
  Administered 2020-03-16: 18:00:00 10 mL/h via INTRAVENOUS

## 2020-03-16 NOTE — Progress Notes
Urologic Surgery Progress NoteAttending Provider: Maia Bates., MDAdmit Date: 1/2/2022Hospital Day: 2Code status: Full Code/ACLSAllergy: CodeineBackground:  Kenneth Bates is a 55 y.o. male w/ PMH of HLD, DM and nephrolithiasis?who was recently admitted to [facility] from 12/31 - 01/01 due to intractable right flank pain from a right 7mm proximal ureteral stone with hydronephrosis. Pain was well controlled and therefore patient was discharged to home. He re-presented to [facility] on 03/15/2020 with worsening pain. Subjective/Interim Events: NAEON. Pt feeling better this am. Received narcotics last pm. No nauseaObjective: Temp:  [98 ?F (36.7 ?C)-98.6 ?F (37 ?C)] 98 ?F (36.7 ?C)Pulse:  [79-102] 79Resp:  [18-20] 18BP: (127-160)/(82-84) 127/82SpO2:  [92 %-99 %] 96 %Device (Oxygen Therapy): room airNo intake/output data recorded. CBCLab Results Component Value Date  WBC 9.8 03/15/2020  HGB 14.4 03/15/2020  HCT 42.40 03/15/2020  PLT 201 03/15/2020 ELECTROLYTESLab Results Component Value Date  NA 137 03/15/2020  K 3.9 03/15/2020  CL 99 03/15/2020  CO2 27 03/15/2020  CREATININE 1.10 03/15/2020  BUN 12 03/15/2020  GLU 110 (H) 03/15/2020  CALCIUM 9.3 03/15/2020  MG 1.9 04/02/2019 PT/INR/PTTNo results found for: INR, PTTMicrobiology:Lab Results Component Value Date  LABURIN No Growth 05/08/2011 PHYSICAL EXAMChris Redmond Bates was present as a chaperone for the GU portion of the physical examGen: nadPulm: non laboredAbd: soft nd, some mild right cva tenderness with palpationAssessment Kenneth Bates is a 55 y.o. male, Hospital Day: 2,  with intractable R flank pain secondary to 7mm proximal right ureteral stonePlan Diet: NPO/IVFPain: Tylenol, Oxycodone SS prnIncentive SpirometryNasal Oxygen PRNDVT PPX: venodynesOOB, AmbulateLabs normal this amUrine negativeHome medsPt booked for add on right ureteral stent possible URS/laser lithotripsyConsent obtained and risks/benefits explained and understood by ptSigned:[name], MDFloor Pager (for primary patients): [phone] Pager: [phone] from 6am-6pm. Amion for nights/weekends.Attending Addendum:  Electronically Signed by Kenneth Market, PA, January 3, 2022GU ATTENDING:Bounce back stonerec Stent today if can obtain OR time.Afebrile and pain is improving.History, PE and [place] imaging reviewed.  Plan as outlined.

## 2020-03-16 NOTE — Discharge Summary
[facility]	Med/Surg Discharge SummaryPatient Data:  Patient Name: Kenneth Bates Age: 55 y.o. DOB: 09-30-1965	 MRN: [id number]	 Admit date: 1/2/2022Discharge date: 1/3/22Discharge Attending Physician: Maia Petties., MD  PCP: Norton Blizzard D.S., MDPrincipal Diagnosis:  Right 7mm Proximal Ureteral StoneOther Diagnosis: s/p right ureteral stent placementDischarged Condition: goodDisposition: Home Allergies Allergies Allergen Reactions  Codeine [facility] Course: 55 y.o. male with a PMHx of HLD, DM and nephrolithiasis who was recently admitted to [facility] from 12/31 - 01/01 due to intractable right flank pain from a right 7mm proximal ureteral stone with hydronephrosis. Pain was well controlled and therefore patient was discharged to home. Over the last 2 days, patient's pain worsened despite narcotic pain medications and he re-presented to the ED and was admitted and kept npo.  He was taken to the OR later in the day for above procedure.Post-operatively he felt well and was voiding and was discharged home.  He will have definitive stone procedure scheduled in the next 2 weeks with Dr. Pleas Patricia.Pertinent Procedures as abovePertinent lab findings and test results: as aboveDischarge vitals: Blood pressure 116/75, pulse 78, temperature 98.4 ?F (36.9 ?C), temperature source Oral, resp. rate 16, height 5' 8 (1.727 m), weight 108 kg, SpO2 96 %.Discharge Physical Exam:Physical ExamPertinent Findings of Physical Exam as aboveCognitive Status at Discharge: goodPending Labs and Tests:  ISSUES TO BE ADDRESSED POST DISCHARGE: 1 follow up definitive stone procedure 2 weeks23Discharge Medications: Discharge: Current Discharge Medication List  START taking these medications  Details cefuroxime (CEFTIN) 500 mg tablet Take 1 tablet (500 mg total) by mouth 2 (two) times daily for 3 days.Qty: 6 tablet, Refills: 0Start date: 03/16/2020, End date: 03/19/2020  phenazopyridine (PYRIDIUM) 200 mg tablet Take 1 tablet (200 mg total) by mouth 3 (three) times daily as needed (dysuria).Qty: 10 tablet, Refills: 0Start date: 03/16/2020   CONTINUE these medications which have NOT CHANGED  Details !! acetaminophen (TYLENOL) 500 mg tablet Take 1,000 mg by mouth every 6 (six) hours as needed.  !! acetaminophen (TYLENOL) 500 mg tablet Take 2 tablets (1,000 mg total) by mouth every 6 (six) hours as needed (pain) for up to 10 days.Qty: 30 tablet, Refills: 0  aspirin 81 mg EC delayed release tablet Take 81 mg by mouth every evening.  atorvastatin (LIPITOR) 10 mg tablet Take 10 mg by mouth every evening.   cholecalciferol, vitamin D3, 25 mcg (1,000 unit) tablet Take 1,000 Units by mouth daily.  cyanocobalamin 1000 MCG tablet Take 1,000 mcg by mouth daily.  ibuprofen (ADVIL,MOTRIN) 800 mg tablet Take 1 tablet (800 mg total) by mouth every 8 (eight) hours as needed.Qty: 21 tablet, Refills: 0  metFORMIN (GLUCOPHAGE) 500 mg Immediate Release tablet Take 500 mg by mouth 2 (two) times daily with breakfast and dinner.   !! oxyCODONE (ROXICODONE) 5 mg Immediate Release tablet Take 1 tablet (5 mg total) by mouth every 6 (six) hours as needed (pain).Qty: 5 tablet, Refills: 0  !! oxyCODONE (ROXICODONE) 5 mg Immediate Release tablet Take 1 tablet (5 mg total) by mouth every 6 (six) hours as needed (pain).Qty: 10 tablet, Refills: 0Start date: 03/15/2020  oxyCODONE-acetaminophen (PERCOCET) 5-325 mg per tablet Take 1 tablet by mouth every 8 (eight) hours as needed (pain).Qty: 12 tablet, Refills: 0  !! tamsulosin (FLOMAX) 0.4 mg 24 hr capsule Take 1 capsule (0.4 mg total) by mouth daily.Qty: 30 capsule, Refills: 0  !! tamsulosin (FLOMAX) 0.4 mg 24 hr capsule Take 1 capsule (0.4 mg total) by mouth nightly.Qty: 30 capsule, Refills: 0   !! -  Potential duplicate medications found. Please discuss with provider.  Marland Kitchen.Follow-up Information:No follow-up provider specified. PMH PSH Past Medical History: Diagnosis Date  Diabetes mellitus (HC Code) (HC CODE)   Hyperlipidemia   No past surgical history on file. Social History Family History Social History Tobacco Use  Smoking status: Never Smoker Substance Use Topics  Alcohol use: No  History reviewed. No pertinent family history. Electronically Signed:Victor [place], [place] 03/16/2020 1:35 PMBest Contact Information: Electronically Signed by Arvilla Market, PA, January 3, 2022Addendum : NONE

## 2020-03-16 NOTE — ED Triage Note
Provider in Triage Note86 y.o. year old male  presents with abdominal pain. Was discharged from hospital yesterday with kidney stone.Physical Exam: NADOrders placed in triage: yesDisposition: Main Emergency Department for further evaluation.Kanae Ignatowski Riordan1/04/2020 7:12 PM

## 2020-03-16 NOTE — Telephone Encounter
Attending Surgeon: Dr. Daphine Deutscher                 Other Surgeon/Resident: _____               Contact Person:  Beeper #Surgery Date: 03/16/20 Inhouse Location: A09H  Location:     [facility]           Time preferred: To follow         Priority LEVEL: 3              ? POST OP ICU BED  Medical Record Number: [id number]           Birthdate: 1965-07-13                Gender: maleLast Name: Baney   First Name:  Kenneth Bates          Middle Initial:  Patient Allergies: Codeine                           ?Known Latex AllergySpecial Needs:  ?Hard of hearing   ?Deaf - needs sign language interpreter   ?Interpreter Required/Language: ____________                            __ Other                       Anticipated Anesthesia:    generalSide of Body:        RIGHT      CPT Code:       40981      Surgeon?s Description:       Cystoscopy, RIGHT ureteral stent placement, possible RIGHT ureteroscopy and laser lithotripsy      Diagnosis:        Ureteral stone      ICD-10 Codes        N20.1      Estimated Length of Surgery:     1 hours  Special Instructions for XB:[id number]:                                                                Tables:           Implants: _____                                                          Vendor:           Special Instruments:                                                  Special Equipment:

## 2020-03-16 NOTE — Discharge Instructions
Below are some guidelines to follow while recovering from your surgery.What to expect:- After a transurethral surgery, you may feel some pain or spasms when you urinate, or the urge to urinate more often.  - It is normal to have some blood in your urine for the first few days after surgery. Blood in the urine is like food coloring, so even a drop can turn the urine very red.Activities: - You may return to your normal activities tomorrow, but refrain from strenuous activity and heavy lifting for the next 4 weeks.Avoid:- No driving while taking narcotic pain medication or muscle relaxer. - Avoid strenuous activity and heavy lifting for the next few weeks. - No swimming or tub bathing.Diet:  - Your usual diet Medications: - Take the prescribed medications as prescribed below. - Pyridium as needed for urinary discomfort. This medication may turn your urine orange. - For pain, you can alternate*** between over-the-counter acetaminophen (Tylenol) and ibuprofen (Motrin, Advil) every 4 hours, so you will end up taking each one every 8 hours. For example, at 8am take acetaminophen, then at 12pm, ibuprofen, then 4pm acetaminophen and 8pm ibuprofen. You can take up to the maximum dose indicated on the label. Do not take more than 4,000mg  of total acetaminophen per day.- Remember to drink lots of water and take gentle laxative/stool softener while on narcotics to prevent constipation.Call your doctor for:- Being unable to keep food down - Increased abdominal pain, distention, nausea or vomiting - Fevers/chills- Shortness of breath or chest pain that does not resolve with rest- Questions or concerns!Follow up:You will follow up with Maia Petties., MD in about 4-6 weeks. A scheduler will call you to schedule your appointment. If you do not hear from them, please call the urology clinic.For any questions or concerns, please call the Urology Clinic at [phone]

## 2020-03-16 NOTE — Plan of Care
Plan of Care Overview/ Patient Status    Received from [facility] ED via stretcher with dx of kidney stone with uncontrolled pain management. Stand-by assist from stretcher to bed. Skin intact. Denies n/v, chills, dysuria, hematuria.  Urinal within reach, voiding spontaneously.  PRN morphine 2mg  given IVP for acceptable relief. NPO for procedure tomorrow, patient aware. FSBS q 6 hrs, WNL. Afebrile.

## 2020-03-16 NOTE — Other
PACU to Floor Nursing Transfer NotePreop Diagnosis: Ureteral stone [N20.1]Procedure Done: * CYSTOSCOPY, RIGHT URETERAL STENT PLACEMENT - GENERAL?Any Significant Events Intra-Op: noneAbnormal Assessment in PACU: noneLevel of Consciousness: awake and alert Last Set of VS:  Vitals:  03/16/20 1344 03/16/20 1345 03/16/20 1349 03/16/20 1400 BP: 124/80 124/80 124/80 120/71 Pulse: 86 (!) 97 85 79 Resp: 18 14 (!) 9 (!) 11 Temp: 97.8 ?F (36.6 ?C)  97.8 ?F (36.6 ?C)  TempSrc: Temporal  (P) Temporal  SpO2: 94% 95% 95% 96% Weight:     Height:     Device (Oxygen Therapy): nasal cannula O2 Flow (L/min): 2Baseline Neuro/developmental Status: WDLLabs Collected: NoSpecial Needs of the Patient: noneAntibiotics: last dose given in OR: ancef 1312IV Access: Periph IV-single(adult) 03/15/20 2325 over-the-needle catheter system 18 gauge Nurse (Active) SiteCare/Dressing Status/Securement dressing dry and intact 03/16/20 1241 Date Dressing Applied/Changed 03/15/20 03/16/20 1241 Next Date Dressing Change 03/22/2020 03/16/20 1241 Lumen 1 Patency/Care Patent;flushed w/o difficulty 03/16/20 1241 Site Signs asymptomatic with no redness, no swelling, no drainage 03/16/20 1241 Phlebitis 0-->no symptoms 03/16/20 1241 Infiltration/Extravasation Assessment 0-->no symptoms 03/16/20 1241 Patient Education Instructed to keep IV site dry;Instructed to call nurse if site is painful,red,swollen, burning;Instructed to call nurse if fluid leaking from site 03/16/20 1241 Daily Review of Necessity ** completed 03/16/20 1241 IV Fluids: ? lactated Ringers 50 mL/hr (03/16/20 0316) ? sodium chloride 125 mL/hr (03/16/20 0054) Pain Assessment: Number Scale (0-10) 03/16/20 0115 Right flank-Pain Rating (0-10): Rest: 5Pain Assessment: Number Scale (0-10) 03/16/20 0115 Right flank-Pain Rating (0-10): Activity: 6 Pain Assessment: Number Scale (0-10) 03/16/20 0115 Right flank-Pharmaceutical Interventions: single medication modality    Body Position: supineHead of Bed (HOB) Positioning: HOB at 60 degrees   Time of Last Void or Time that Urinary Catheter was Removed Intra-Op: last voided at: 1400 Additional Info: Marland KitchenMarland KitchenContact RN (name and phone number): Ronaldo Miyamoto [phone]

## 2020-03-16 NOTE — ED Notes
10:00 PM- Pt presents to the ED for uncontrolled flank pain from a diagnosed kidney stone. Pt was diagnosed with a kidney stone yesterday and was discharged home with oxycodone for pain management. Pt states pain is unrelieved with pain medicine. Pt is A+Ox4 and VSS. RR even and unlabored. Pulses equal bilateral. Pt denies CP, SOB, cough, fever, headache, weakness, dizziness, or N/V. Will continue to monitor pt. 12:57 AM Pt resting on bed with no distress noted. Pt rates pain 1/10 after pain medication administration. Will continue to monitor pt.

## 2020-03-16 NOTE — Brief Op Note
[facility] HealthPatient Name: Kenneth Bates        [id number] Patient DOB: 09/30/1965     Surgery Date: 1/3/2022Surgeon(s) and Role:   * Hesse, Milda Smart., MD - PrimaryAssistant(s):Resident: Modena Morrow, MDStaff:  Circulator: Ophelia Charter, RNRelief Circulator: Westley Hummer, RNScrub Person: Cyndie Chime, CaPre-Op Diagnosis: Ureteral stone [N20.1] Procedure(s) and Anesthesia Type:   * CYSTOSCOPY, RIGHT URETERAL STENT PLACEMENT - GENERALOperative Findings (enter relevant operative findings; do not refer to an operative report that is not yet transcribed): right ureteral stent placement, debris seen exiting right UO during stent placementSigns of infection present at the time of surgery at the operative site: debris seen exiting right UO during stent placement  Blood and Blood Products: none                 Drains:  noneImplants: Implant Name Type Inv. Item Serial No. Manufacturer Lot No. LRB No. Used Action STENT URETERAL PERCUFLEX 6-26 - [id number] Implant STENT URETERAL PERCUFLEX 6-26  [employer] [id number] Right 1 Implanted  Specimens: * No specimens in log * Intra-op Labs (16h ago, onward)   Start     Ordered  03/16/20 1336  Urine culture  [[id number]]  Once      References:    Urine Culture Algorithm for Catheterized Patients Question:  Release to patient  Answer:  Immediate  03/16/20 1335    Clinical Staging: N/aEBL: Minimal       Post Operative Diagnosis: same Maralyn Sago, MD1/3/20221:55 PM

## 2020-03-16 NOTE — Anesthesia Pre-Procedure Evaluation
This is a 56 y.o. male scheduled for CYSTOSCOPY, RIGHT URETERAL STENT PLACEMENT, POSSIBLE RIGHT URETEROSCOPY ANS LASER LITHOTRIPSY (Right ).Review of Systems/ Medical HistoryPatient summary, nursing notes, EKG/Cardiac Studies , Labs, pre-procedure vitals, height, weight and NPO status reviewed.No previous anesthesia concernsAnesthesia Evaluation:   No history of anesthetic complications  Estimated body mass index is 36.2 kg/m? as calculated from the following:  Height as of this encounter: 5' 8 (1.727 m).  Weight as of this encounter: 108 kg. CC/HPI: 54y obese M presenting for CYSTOSCOPY, RIGHT URETERAL STENT PLACEMENT, POSSIBLE RIGHT URETEROSCOPY ANS LASER LITHOTRIPSY Past Surgical History:  No past surgical history on file.Cardiovascular: Negative   Patient has a history of: hypercholesterolemia (on atorvastatin). -Exercise tolerance: >4 METS -Vascular Disease:  Negative   Respiratory:  Negative.HEENT: Negative.Neuromuscular: NegativeSkeletal/Skin:  NegativeGastrointestinal/Genitourinary:  Negative -Hepatic Disorders:  He does not have liver disease.-Nutritional Disorders: Patient has has increased body weight- obesity.-Comments: [place] ABDOMEN/PELVIS 12/31/2021FINDINGS:LUNG BASES: Unremarkable.?LIVER: Hepatic steatosis is seen.GALLBLADDER: There are gallstones.SPLEEN: Unremarkable.PANCREAS: Unremarkable.ADRENALS: Unremarkable.KIDNEYS: There is a 7 mm stone in the right proximal ureter with mild upstream dilatation of the collecting system associated with new proximal periureteral and perinephric fat stranding. Additional nonobstructing right renal stones are noted measuring up to 6 mm. The previously seen left distal ureteral stone is no longer present, with interval resolution of left perinephric fat stranding and collecting system dilatation.?BOWEL: No evidence of bowel obstruction is seen.APPENDIX: Unremarkable.PERITONEUM: No intraperitoneal free fluid. No intraperitoneal free air.?LYMPH NODES: No lymphadenopathy is seen.VESSELS: The abdominal aorta is atherosclerotic but nonaneurysmal.?URINARY BLADDER: Unremarkable.PELVIS: The prostate is mildly enlarged.?BONES & SOFT TISSUE: No aggressive osseous lesion identified. Small fat-containing inguinal hernias, left greater than right. Diastasis of the rectus abdominis muscles is again noted. There is a small fat-containing umbilical hernia.?IMPRESSION:?Obstructing proximal right ureteral calculus with mild hydroureteronephrosis.Hematological/Lymphatic: Negative-Anemia: Patient has anemia (Hct 38.4).  -Coagulopathy:  He has no thrombocytopenia.Endocrine/Metabolic:  Negative.-Diabetes mellitus:  Patient has diabetes mellitus (on metformin) type 2.Behavioral/Psychiatric & Syndromes: NegativePhysical ExamCardiovascular:    normal exam  Rhythm: regularHeart Sounds: S1 present and S2 present.Pulmonary:  normal exam  Patient's breath sounds clear to auscultationAirway:  Mallampati: IITM distance: >3 FBNeck ROM: fullDental:  Dentition: missingAnesthesia PlanASA 3 The primary anesthesia plan is  general. Perioperative Code Status confirmed: It is my understanding that the patient is currently designated as 'Full Code' and will remain so throughout the perioperative period.Anesthesia informed consent obtained. Consent obtained from: patientUse of blood products: consented  The post operative pain plan is IV analgesics.Plan discussed with CRNA.Anesthesiologist's Pre Op NoteI personally evaluated and examined the patient prior to the intra-operative phase of care.

## 2020-03-16 NOTE — Other
Operative Diagnosis:Pre-op:   Ureteral stone [N20.1] Patient Coded Diagnosis   Pre-op diagnosis: Ureteral stone  Post-op diagnosis: Ureteral stone  Patient Diagnosis   Pre-op diagnosis: Ureteral stone [N20.1]  Post-op diagnosis:     Post-op diagnosis:   * Ureteral stone [N20.1]Operative Procedure(s) :Procedure(s) (LRB):CYSTOSCOPY, RIGHT URETERAL STENT PLACEMENT (Right)Post-op Procedure & Diagnosis ConfirmationPost-op Diagnosis: Post-op Diagnosis confirmed (no changes)Post-op Procedure: Post-op Procedure confirmed (no changes)Anesthesia ClarifiersCysto/Renal:  Transurethral procedures including urethrocystoscopy including fragmentation, manipulation and/or removal ureteral calculus NOS

## 2020-03-16 NOTE — Telephone Encounter
Pt s/p right ureteral stent today, pt will need right URS/laser lithotripsy and right ureteral stent exchange scheduled in the next 2 weeks, please coordinate with Dr. Pleas Patricia and call pt with date and time thank you.

## 2020-03-16 NOTE — Other
Post Anesthesia Transfer of Care NotePatient: Kenneth PalmaProcedure(s) Performed: Procedure(s) (LRB):CYSTOSCOPY, RIGHT URETERAL STENT PLACEMENT (Right) Patient location: PACU Last Vitals: Vitals Value Taken Time BP 124/80 03/16/20 1349 Temp 36.6 ?C 03/16/20 1349 Pulse 82 03/16/20 1350 Resp 17 03/16/20 1350 SpO2 95 % 03/16/20 1350 Vitals shown include unvalidated device data.Level of consciousness: awake and alert Transport Vital Signs:  Stable since the last set of recorded intra-operative vital signsComplications: noneIntra-operative Intake & Output and Antibiotics as per Anesthesia record and discussed with the RN.

## 2020-03-16 NOTE — Plan of Care
Problem: Adult Inpatient Plan of CareGoal: Plan of Care ReviewOutcome: Interventions implemented as appropriate Plan of Care Overview/ Patient Status    Patient alert and oriented times 4, vss and call light within reach, pain 0/10, no request for pain medication at this time, lungs clear and triflo used appropriately, no complaints of any kind and skin intact, out of bed independently, v-boots on while in bed, NPO for upcoming surgery and mouth rinse provided, voiding spontaneously in adequate amounts and no blood noted, understands plan of care and is p-leasant and cooperative

## 2020-03-16 NOTE — Telephone Encounter
Pt called answering service to alert Urology provider that he is on his way to the emergency room due to unrelenting pain from known obstructing right proximal ureteral stone. Pain persists despite opioid administration. Denies fevers, chills, nausea/vomiting. Please call on arrival to ER will likely admit to Urology service and add on for cystoscopy, stent placement, possible ureteroscopy tomorrow. Message forwarded to Dr. Daphine Deutscher.

## 2020-03-16 NOTE — ED Provider Notes
HistoryChief Complaint Patient presents with ? Flank Pain   R flank pain, known kidney stone reports pain not being controlled at home. Reports he is able to urinate, denies fevers.   55 year old male history of hypertension, diabetes, renal stones who presents with flank pain.  Patient has known 7 mm renal stone.  Patient returns with pain.  Patient reports that he has continued to make urine, denies fever, chills, nausea, vomiting, abdominal pain, diarrhea, chest pain, shortness of breath.The history is provided by the patient. Flank PainThis is a new problem. The current episode started more than 2 days ago. The problem occurs constantly. The problem has been gradually worsening. Pertinent negatives include no chest pain, no abdominal pain, no headaches and no shortness of breath. Nothing relieves the symptoms.  Past Medical History: Diagnosis Date ? Diabetes mellitus (HC Code) (HC CODE)  ? Hyperlipidemia  No past surgical history on file.No family history on file.Social History Socioeconomic History ? Marital status: Married   Spouse name: Not on file ? Number of children: Not on file ? Years of education: Not on file ? Highest education level: Not on file Tobacco Use ? Smoking status: Never Smoker Substance and Sexual Activity ? Alcohol use: No ? Drug use: No Social History Narrative  ** Merged History Encounter **    ED Other Social History E-cigarette/Vaping Substances E-cigarette/Vaping Devices Review of Systems Constitutional: Negative for chills and fever. Respiratory: Negative for shortness of breath.  Cardiovascular: Negative for chest pain. Gastrointestinal: Negative for abdominal pain. Genitourinary: Positive for flank pain. Negative for dysuria and hematuria. Neurological: Negative for headaches. All other systems reviewed and are negative. Physical ExamED Triage Vitals [03/15/20 1914]BP: (!) 160/82Pulse: (!) 102Pulse from  O2 sat: n/aResp: 18Temp: 98.6 ?F (37 ?C)Temp src: OralSpO2: 99 % BP (!) 160/82  - Pulse (!) 102  - Temp 98.6 ?F (37 ?C) (Oral)  - Resp 18  - SpO2 99% Physical ExamVitals reviewed. HENT:    Head: Normocephalic.    Nose: Nose normal.    Mouth/Throat:    Mouth: Mucous membranes are moist. Eyes:    Extraocular Movements: Extraocular movements intact. Cardiovascular:    Rate and Rhythm: Normal rate.    Pulses: Normal pulses. Pulmonary:    Effort: Pulmonary effort is normal. Abdominal:    General: There is no distension.    Palpations: Abdomen is soft.    Tenderness: There is no abdominal tenderness. Genitourinary:   Comments: R flank painMusculoskeletal:    Cervical back: Normal range of motion. Neurological:    Mental Status: He is alert. Psychiatric:       Mood and Affect: Mood normal.  ProceduresProcedures ED COURSEPatient Reevaluation: 55 year old male presenting with flank pain with known renal stone.  Patient will receive pain control.  Will get labs to evaluate renal function.  Will get UA to evaluate for UTI.  Patient will be admitted to urology. Pt is not septic at this time. Clinical Impressions as of Mar 15 2233 Renal stone  ED DispositionAdmit Lynda Rainwater, DO01/02/22 2235

## 2020-03-16 NOTE — Plan of Care
Problem: Adult Inpatient Plan of CareGoal: Readiness for Transition of CareOutcome: Initial problem identification Plan of Care Overview/ Patient Status    55 year old male presented to ED with continuation of left flank pain without relief from narcotics.  Patient with recent admission 03/13/2020 - /03/2020 for renal colic and discharged to home with medication and no services.Patient scheduled for surgical intervention today. Case management met with patient at bedside preoperatively with no anticipated needs upon discharge when clinically ready for discharge. Case Management Screening    Most Recent Value Case Management Screening: Chart review completed. If YES to any question below then proceed to CM Eval/Plan Is there a change in their cognitive function No Is there a change in their base line physical function. No Has there been a readmission within the last 30 days Yes Were there services prior to admission ( Examples: Assisted Living, HD, Homecare, Extended Care Facility, Methadone, SNF, Outpatient Infusion Center) No Negative/Positive Screen Positive Screening: Complete CM Evaluation and Plan IF Screening Completed by TC  Screening completed by: Name Lou Cal Role: Transition Coordinator Case Manager Attestation: Choose which ONE is appropriate for you I have reviewed the medical record and agree with the above screening by the transition coordinator with the following recommendations. Yes   Case Management Evaluation    Most Recent Value Case Management Evaluation and Plan Arrived from prior to admission home/apartment/condo Admitted from: home Do you have a caregiver? No  Lives With Spouse Services Prior to Admission none Patient Requires Care Coordination Intervention Due To readmission within the last 30 days Prior to Hospitalization: Assistance Needed/DME being used None Documented Insurance Accurate Yes Any financial concerns related to anticipated discharge needs No Patient's home address verified Yes Living Environment  Lives With Spouse Source of Clinical History Patient's clinical history has been reviewed and source of Information is: Medical record IF Evaluation Completed by TC: Completed Evaluation by: Name Lou Cal Role: Transition Coordinator Case Manager Attestation: Choose which ONE is appropriate for you I have reviewed the medical record and agree with the above evaluation by the transition coordinator with the following recommendations. Yes Discharge Planning Coordination Recommendations Discharge Planning Coordination Recommendations Home with MD follow-up/No needs identified Case Manager reviewed plan of care/ continuum of care need's with  Patient, Interdisciplinary Team   Emilie Rutter RN [facility] ManagementMHB [phone]

## 2020-03-16 NOTE — Utilization Review (ED)
UM Status: Commercial - IP, obstructing ureteral calculus, admitted to Urology.

## 2020-03-16 NOTE — Other
[facility]	 [facility]	Urology Consult NoteSubjective: Reason for Consultation: R flank pain iso obstructing right proximal ureteral stoneHPI: Kenneth Bates is a 55 y.o. male with a PMHx of HLD, DM and nephrolithiasis who was recently admitted to [facility] from 12/31 - 01/01 due to intractable right flank pain from a right 7mm proximal ureteral stone with hydronephrosis. Pain was well controlled and therefore patient was discharged to home. Over the last 2 days, patient's pain worsened despite narcotic pain medications. Patient called the Urology answering service and was recommended to present to the ER for admission. Currently, patient reports right flank pain, 7 /10. Denies nausea/vomiting, fevers/chills, dysuria, hematuria, incomplete emptying. On evaluation, patient was afebrile, HDS. Labs pending. UA with 26 RBC/HPF, no concern for infection.Medical History: PMH PSH Past Medical History: Diagnosis Date ? Diabetes mellitus (HC Code) (HC CODE)  ? Hyperlipidemia   No past surgical history on file. Social History Family History Social History Tobacco Use ? Smoking status: Never Smoker Substance Use Topics ? Alcohol use: No  No family history on file. Prior to Admission Medications (Not in a hospital admission) Allergies Allergies Allergen Reactions ? Codeine Hives  Review of Systems: A 12 point review of systems was conducted that was negative unless noted in the HPIObjective: Vitals:Temp:  [98.6 ?F (37 ?C)] 98.6 ?F (37 ?C)Pulse:  [102] 102Resp:  [18] 18BP: (160)/(82) 160/82SpO2:  [99 %] 99 %Device (Oxygen Therapy): room airIns/Outs:No intake or output data in the 24 hours ending 03/15/20 1944Physical Exam: Gen: NAD, AOx3HEENT: NCATCVS: Regular ratePulm: Breathing comfortably on room air, bilateral chest riseAbd: Soft, non-tender, non-distended, no guarding or reboundGU: + R CVAT, no L CVA or suprapubic tendernessExt: WWPLabs: Recent Labs Lab 12/31/210950 12/31/210950 01/01/220639 WBC 11.9*  --  8.4 HGB 16.4   < > 14.1 HCT 47.20   < > 42.20 PLT 213   < > 187  < > = values in this interval not displayed.  Recent Labs Lab 01/01/220639 NEUTROPHILS 66.0  Recent Labs Lab 12/31/210950 12/31/211752 01/01/220639 01/01/220730 01/01/221114 NA 133*  --  139  --   --  K 4.8  --  4.3  --   --  CL 96*  --  104  --   --  CO2 26  --  25  --   --  BUN 13  --  11  --   --  CREATININE 0.80  --  0.90  --   --  GLU 153*   < > 137*   < > 110* ANIONGAP 11  --  10  --   --   < > = values in this interval not displayed.  Recent Labs Lab 12/31/210950 01/01/220639 CALCIUM 9.3 8.8  Recent Labs Lab 12/31/210950 ALT 34 AST 29 ALKPHOS 69 BILITOT 0.4 BILIDIR <0.2  No results for input(s): PTT, LABPROT, INR in the last 168 hours. Micro:Lab Results Component Value Date  LABURIN No Growth 05/08/2011 	Diagnostics:No results found.Assessment: 55 yo M with intractable R flank pain secondary to 7mm proximal right ureteral stone. All management options were discussed including watchful waiting (do nothing), ESWL, and ureteral stent placement with interval ureteroscopy & laser lithotripsy. After considering the patient?s overall clinical picture, risk and benefits of each procedure and general anesthesia, expected outcomes, and the patient?s wishes, the patient desired admission to undergo right ureteral stent, possible right ureteroscopy and laser lithotripsy. Plan: - Admit to Urology under Dr. Daphine Deutscher- NPO/IVF - Follow-up COVID swab- Booked right ureteral stent placement, possible URS/LL- Monitor urine  output - Strain All Urine- Flomax qHS- Pain control- Nausea control- Please contact Urology IMMEDIATELY if patient becomes febrile or hemodynamically unstable- Discussed with attending, Dr. MartinSigned:Stephanie Abigail Miyamoto, MDUrology PGY-3Consult pager: [phone]

## 2020-03-17 ENCOUNTER — Telehealth: Admit: 2020-03-17 | Payer: PRIVATE HEALTH INSURANCE | Attending: Urology | Primary: Internal Medicine

## 2020-03-17 DIAGNOSIS — E119 Type 2 diabetes mellitus without complications: Secondary | ICD-10-CM

## 2020-03-17 DIAGNOSIS — N132 Hydronephrosis with renal and ureteral calculous obstruction: Secondary | ICD-10-CM

## 2020-03-17 DIAGNOSIS — Z20822 Contact with and (suspected) exposure to covid-19: Secondary | ICD-10-CM

## 2020-03-17 DIAGNOSIS — E669 Obesity, unspecified: Secondary | ICD-10-CM

## 2020-03-17 DIAGNOSIS — Z6836 Body mass index (BMI) 36.0-36.9, adult: Secondary | ICD-10-CM

## 2020-03-17 DIAGNOSIS — D649 Anemia, unspecified: Secondary | ICD-10-CM

## 2020-03-17 DIAGNOSIS — Z79899 Other long term (current) drug therapy: Secondary | ICD-10-CM

## 2020-03-17 DIAGNOSIS — E7800 Pure hypercholesterolemia, unspecified: Secondary | ICD-10-CM

## 2020-03-17 DIAGNOSIS — E785 Hyperlipidemia, unspecified: Secondary | ICD-10-CM

## 2020-03-17 DIAGNOSIS — I10 Essential (primary) hypertension: Secondary | ICD-10-CM

## 2020-03-17 DIAGNOSIS — Z7982 Long term (current) use of aspirin: Secondary | ICD-10-CM

## 2020-03-17 DIAGNOSIS — Z885 Allergy status to narcotic agent status: Secondary | ICD-10-CM

## 2020-03-17 DIAGNOSIS — Z7984 Long term (current) use of oral hypoglycemic drugs: Secondary | ICD-10-CM

## 2020-03-17 LAB — URINE CULTURE: BKR URINE CULTURE, ROUTINE: NO GROWTH

## 2020-03-17 NOTE — Telephone Encounter
Hi Dr Pleas Kenneth Bates, will you be doing this pts procedure? It looks like you stented and requested a follow up surgery. Just double checking. ThanksNicole

## 2020-03-17 NOTE — Other
CONFIDENTIAL - DO NOT COPY WITHOUT APPROPRIATE AUTHORIZATION Name: Kenneth Bates: [id number] CSN: [id number] Service Area: UrologyDate of Birth: Mar 07, 1966 Date of Adm: 1/2/2022Date of Procedure/Surgery: 1/3/2022Operation: cystoscopy,right retrograde pyelogram with intraoperative interpretation images, right ureteral stent placementPre-Operative Diagnosis: Proximal right ureteral stone  Post-Operative Diagnosis: Same as aboveSurgeon: Attending: Royann Shivers., MDAssistant: Resident: Modena Morrow, MDAnesthesia: ANESTHESIOLOGIST: Raphael Gibney, MDCRNA: Melven Sartorius, CRNAType: General Estimated Blood Loss: MinimalDrains/Devices: NoneSpecimens: Urine culture Findings: right ureteral stent placement, debris seen exiting right UO during stent placement?OPERATIVE INDICATIONS: The patient is a 55 y.o. male with a PMHx of HLD, DM, and nephrolithiasis who presented with intractable right sided flank pain and was found to have an obstructing right 7 mm proximal ureteral stone with mild hydronephrosis. The patient was therefore planned for cystoscopy, right retrograde pyelogram, and right ureteral stent placement. The procedure was described in detail to the patient. The patient expressed understanding of the procedure and its alternatives, benefits, and risks, and decided to proceed as planned. ?OPERATIVE NOTE: After obtaining informed consent, the patient was brought to the operating room and positioned supine on the operating room table. A universal protocol time out procedure was performed in which the identity of the patient, the planned procedure, and the laterality of the procedure were confirmed. The anesthesia team initiated cardiopulmonary monitoring. Sequential compression devices were applied to the patient's bilateral lower extremities for deep vein thrombosis prophylaxis, and intravenous antibiotics in the form of Ancef were administered to the patient for infection prophylaxis. The anesthesia team then induced general anesthesia and intubated the patient without difficulty. The patient was then repositioned into the dorsal lithotomy, and prepped and draped in the usual sterile fashion. ?  Surgical pause was performed.??A 22 Fr rigid cystoscope was then inserted via the patient's urethra into the bladder. The bilateral ureteral orifices were orthotopic. Upon identification of the rightureteral orifice, a sensor wire was inserted and advanced into the renal pelvis as confirmed by fluoroscopy.  Right retrograde pyelogram was performed through a 5 French pollock catheter which demonstrated proximal hydronephrosis.  Next, a 6 Fr x 26 cm double-J ureteral stent without a string was then advanced until the proximal curl was confirmed in the renal pelvis by fluoroscopy and a distal curl was confirmed in the bladder by direct visualization. The bladder was then emptied and the rigid cystoscope was removed. The patient tolerated the procedure without immediate complication.?All instrument, and sponge counts were correct x 2. At the conclusion of the procedure, a universal protocol debriefing was performed in accordance with a standard department-approved checklist. The patient tolerated the procedure well, was extubated and awoken from general anesthesia without complications. The patient was brought to the post-anesthesia recovery room in stable condition.  Surgical debriefing was performed.?Pleas Patricia, Milda Smart., MD was present and scrubbed for all the critical portions/the entirety of the operation.?PLAN: Discharge home Follow up in 4-6 weeks for definitive treatment of right ureteral stone

## 2020-03-17 NOTE — Anesthesia Post-Procedure Evaluation
Anesthesia Post-op NotePatient: Kenneth PalmaProcedure(s):  Procedure(s) (LRB):CYSTOSCOPY, RIGHT URETERAL STENT PLACEMENT (Right) Patient location: PACULast Vitals:  I have noted the vital signs as listed in the nursing notes.Mental status recovered: patient participates in evaluation: YesVital signs reviewed: YesRespiratory function stable:YesAirway is patent: YesCardiovascular function and hydration status stable: YesPain control satisfactory: YesNausea and vomiting control satisfactory:Yes  No complications documented.

## 2020-03-19 NOTE — Telephone Encounter
Called patient and left VM message to call me back in order to schedule surgery with Dr. Pleas Patricia.

## 2020-03-20 ENCOUNTER — Encounter: Admit: 2020-03-20 | Payer: PRIVATE HEALTH INSURANCE | Primary: Internal Medicine

## 2020-03-20 NOTE — Telephone Encounter
Spoke to patient and confirmed his surgery date of 04/06/20 with Dr. Pleas Patricia. Patient recently discharged from hospital, and all info in Epic up to date. Nursing, please call patient to go over his pre-op instructions. Thank you.

## 2020-03-26 NOTE — Telephone Encounter
error 

## 2020-03-26 NOTE — Telephone Encounter
FYI--Patient is scheduled for surgery on 04/06/20 with Dr. Pleas Patricia. Will wait on post op recommendation to schedule post op accordingly.

## 2020-03-30 ENCOUNTER — Encounter: Admit: 2020-03-30 | Payer: PRIVATE HEALTH INSURANCE | Attending: Adult Health | Primary: Internal Medicine

## 2020-03-30 DIAGNOSIS — Z01818 Encounter for other preprocedural examination: Secondary | ICD-10-CM

## 2020-03-31 ENCOUNTER — Encounter: Admit: 2020-03-31 | Payer: PRIVATE HEALTH INSURANCE | Attending: Urology | Primary: Internal Medicine

## 2020-03-31 DIAGNOSIS — N200 Calculus of kidney: Secondary | ICD-10-CM

## 2020-03-31 DIAGNOSIS — E119 Type 2 diabetes mellitus without complications: Secondary | ICD-10-CM

## 2020-03-31 DIAGNOSIS — E785 Hyperlipidemia, unspecified: Secondary | ICD-10-CM

## 2020-04-03 ENCOUNTER — Inpatient Hospital Stay: Admit: 2020-04-03 | Discharge: 2020-04-03 | Payer: PRIVATE HEALTH INSURANCE | Primary: Internal Medicine

## 2020-04-03 DIAGNOSIS — Z01818 Encounter for other preprocedural examination: Secondary | ICD-10-CM

## 2020-04-04 ENCOUNTER — Telehealth
Admit: 2020-04-04 | Payer: PRIVATE HEALTH INSURANCE | Attending: Student in an Organized Health Care Education/Training Program | Primary: Internal Medicine

## 2020-04-04 ENCOUNTER — Encounter: Admit: 2020-04-04 | Payer: PRIVATE HEALTH INSURANCE | Attending: Anesthesiology | Primary: Internal Medicine

## 2020-04-04 ENCOUNTER — Encounter: Admit: 2020-04-04 | Payer: PRIVATE HEALTH INSURANCE | Attending: Urology | Primary: Internal Medicine

## 2020-04-04 DIAGNOSIS — Z01812 Encounter for preprocedural laboratory examination: Secondary | ICD-10-CM

## 2020-04-04 DIAGNOSIS — U071 COVID-19: Secondary | ICD-10-CM

## 2020-04-04 LAB — COVID-19 CLEARANCE OR FOR PLACEMENT ONLY: BKR SARS-COV-2 RNA (COVID-19) (YH): DETECTED — AB

## 2020-04-04 NOTE — Telephone Encounter
CC: positive pre-operative covid test. Patient is scheduled for surgery tomorrow with Dr. Pleas Patricia, however his pre-operative covid test is positive. Explained that he should quarantine himself for at least 5 days per [facility] guidelines and that Dr. Pleas Patricia will contact him to let him know if his surgery will be taking place or if it is cancelled.

## 2020-04-06 ENCOUNTER — Encounter: Admit: 2020-04-06 | Payer: PRIVATE HEALTH INSURANCE | Attending: Anesthesiology | Primary: Internal Medicine

## 2020-04-07 ENCOUNTER — Telehealth: Admit: 2020-04-07 | Payer: PRIVATE HEALTH INSURANCE | Attending: Urology | Primary: Internal Medicine

## 2020-04-07 ENCOUNTER — Encounter: Admit: 2020-04-07 | Payer: PRIVATE HEALTH INSURANCE | Attending: Urology | Primary: Internal Medicine

## 2020-04-07 DIAGNOSIS — N201 Calculus of ureter: Secondary | ICD-10-CM

## 2020-04-07 DIAGNOSIS — Z01818 Encounter for other preprocedural examination: Secondary | ICD-10-CM

## 2020-04-07 DIAGNOSIS — N390 Urinary tract infection, site not specified: Secondary | ICD-10-CM

## 2020-04-07 DIAGNOSIS — R399 Unspecified symptoms and signs involving the genitourinary system: Secondary | ICD-10-CM

## 2020-04-07 MED ORDER — CEPHALEXIN 500 MG CAPSULE
500 mg | ORAL_CAPSULE | Freq: Three times a day (TID) | ORAL | 1 refills | Status: AC
Start: 2020-04-07 — End: ?

## 2020-04-07 MED ORDER — PHENAZOPYRIDINE 200 MG TABLET
200 mg | ORAL_TABLET | Freq: Three times a day (TID) | ORAL | 1 refills | Status: AC | PRN
Start: 2020-04-07 — End: ?

## 2020-04-07 NOTE — Telephone Encounter
Spoke with patient and confirmed 2 week post op with Dr. Pleas Patricia on 06/10/20 @ [facility]

## 2020-04-07 NOTE — Telephone Encounter
Spoke to [place] who will give a urine sample tomorrow morning and then pick up the keflex and pyridium, uc [facility] at [facility] at [facility] request.

## 2020-04-07 NOTE — Telephone Encounter
Called Kenneth Bates to inform him of urine test order placed and to go over pre op instructions. Pt is complaining of urgency, frequency and burning with urination. Pt is requesting prescription for pyridium and and an antibiotic. Will route to provider.

## 2020-04-07 NOTE — Telephone Encounter
Called Kenneth Bates letting him know meds were ordered for him. Pt said he will go to lab to drop urine off tomorrow.

## 2020-04-07 NOTE — Telephone Encounter
[facility] MESSAGETime of call:   4:27 PMCaller:   Jameison PalmaCaller's relationship to patient:  n/a  Calling from [employer], hospital, agency, etc.):  n/a   Reason for call:   Multipe requests to reschedule procedure since testing positive for Covid at pre procedural test If not feeling well, what are symptoms:  n/a   If having symptoms, how long have the symptoms been present:  n/a   Does caller request to speak to someone urgently?  no   Best telephone number for callback:   [phone] Best time to return call:   any Permission to leave message:  yes   [facility]

## 2020-04-07 NOTE — Telephone Encounter
Patient called back he would like to know when he can re book his procedure please call him at [phone] thank you

## 2020-04-07 NOTE — Telephone Encounter
Spoke to patient and rescheduled his surgery to 3/15 (canceled due to positive Covid results) with Dr. Pleas Patricia. Patient will see his PCP for medical clearance and will go to [facility] 2 weeks before his surgery for urine culture. Nursing, please place order to [facility] and call patient to go over his pre-op instructions and he is also requesting antibiotics.  Please reschedule post-op OV to 2 weeks after new surgery date. Thank you.

## 2020-04-10 ENCOUNTER — Telehealth: Admit: 2020-04-10 | Payer: PRIVATE HEALTH INSURANCE | Attending: Urology | Primary: Internal Medicine

## 2020-04-10 ENCOUNTER — Encounter: Admit: 2020-04-10 | Payer: PRIVATE HEALTH INSURANCE | Attending: Urology | Primary: Internal Medicine

## 2020-04-10 LAB — URINE CULTURE

## 2020-04-10 NOTE — Telephone Encounter
Contacted Cheral Almas and let him know his UC came back negative and he can stop his antibiotics.  Reminded him to do a urine culture again before his surgery in March. encouraged to drink fluids.  Will call if any questions or concerns.

## 2020-04-10 NOTE — Telephone Encounter
Kenneth Bates is calling for urine culture results. Result as below.He has a stent in place and states that he is having urinary urgency/frequency. He had hematuria at the time of his urine sample and he has not been increasing his fluid intake. He will begin to increase his fluid intake.Denies fever/chills. Marland Kitchen He is Covid 19 positive right now.The patient is taking keflex as prescribed and wants to know from the MD if he should stop it due to his UC result.He will continue on pyridium.Message routed. Jahrell Hamor is scheduled for stone surgery 05/26/20.Urine culture - Lab CollectOrder: [id number] Status:  Final result Visible to patient:  Yes (not seen) Next appt:  06/10/2020 at 10:00 AM in Urology Milda Smart. Hesse, MD) Dx:  UTI symptomsSpecimen Information: Urine, Clean Catch; Culture ?  	0 Result NotesComponent 04/08/20 ?3:07 PM Urine Culture, Routine SEE NOTE  Comment:  ? CULTURE, URINE, ROUTINE ? ? Micro Number: ? ? ?[id number] ? Test Status: ? ? ? Final ? Specimen Source: ? Urine, clean catch ? Specimen Quality: ?Adequate ? Result: ? ? ? ? ? ?No Growth  Resulting Agency [facility]  Resulting Agency's CommentPerforming Lab:

## 2020-04-27 ENCOUNTER — Inpatient Hospital Stay: Admit: 2020-04-27 | Discharge: 2020-04-27 | Payer: PRIVATE HEALTH INSURANCE | Primary: Internal Medicine

## 2020-04-27 DIAGNOSIS — Z20828 Contact with and (suspected) exposure to other viral communicable diseases: Secondary | ICD-10-CM

## 2020-04-27 DIAGNOSIS — Z20822 Contact with and (suspected) exposure to covid-19: Secondary | ICD-10-CM

## 2020-04-28 LAB — COVID-19 CLEARANCE OR FOR PLACEMENT ONLY: BKR SARS-COV-2 RNA (COVID-19) (YH): NOT DETECTED

## 2020-04-30 ENCOUNTER — Telehealth: Admit: 2020-04-30 | Payer: PRIVATE HEALTH INSURANCE | Attending: Urology | Primary: Internal Medicine

## 2020-04-30 NOTE — Telephone Encounter
Magic Mohler called and states he received a call that his surgery date needed to be changed to 03/14. Patient is calling back to confirm but I do not see any documentation of any change. Please contact patient to confirm.

## 2020-05-01 NOTE — Telephone Encounter
Spoke to patient and confirmed his new surgery date of 3/14 with Dr. Pleas Patricia.

## 2020-05-15 ENCOUNTER — Encounter: Admit: 2020-05-15 | Payer: PRIVATE HEALTH INSURANCE | Attending: Urology | Primary: Internal Medicine

## 2020-05-15 ENCOUNTER — Encounter
Admit: 2020-05-15 | Payer: PRIVATE HEALTH INSURANCE | Attending: Vascular and Interventional Radiology | Primary: Internal Medicine

## 2020-05-15 DIAGNOSIS — E785 Hyperlipidemia, unspecified: Secondary | ICD-10-CM

## 2020-05-15 DIAGNOSIS — E119 Type 2 diabetes mellitus without complications: Secondary | ICD-10-CM

## 2020-05-15 DIAGNOSIS — N200 Calculus of kidney: Secondary | ICD-10-CM

## 2020-05-16 LAB — URINE CULTURE

## 2020-05-18 ENCOUNTER — Encounter: Admit: 2020-05-18 | Payer: PRIVATE HEALTH INSURANCE | Attending: Urology | Primary: Internal Medicine

## 2020-05-22 ENCOUNTER — Ambulatory Visit
Admit: 2020-05-22 | Payer: PRIVATE HEALTH INSURANCE | Attending: Interventional Pain Medicine | Primary: Internal Medicine

## 2020-05-22 DIAGNOSIS — N201 Calculus of ureter: Secondary | ICD-10-CM

## 2020-05-25 ENCOUNTER — Telehealth
Admit: 2020-05-25 | Payer: PRIVATE HEALTH INSURANCE | Attending: Student in an Organized Health Care Education/Training Program | Primary: Internal Medicine

## 2020-05-25 ENCOUNTER — Ambulatory Visit
Admit: 2020-05-25 | Payer: PRIVATE HEALTH INSURANCE | Attending: Interventional Pain Medicine | Primary: Internal Medicine

## 2020-05-25 ENCOUNTER — Encounter: Admit: 2020-05-25 | Payer: PRIVATE HEALTH INSURANCE | Attending: Urology | Primary: Internal Medicine

## 2020-05-25 ENCOUNTER — Inpatient Hospital Stay: Admit: 2020-05-25 | Discharge: 2020-05-25 | Payer: PRIVATE HEALTH INSURANCE | Primary: Internal Medicine

## 2020-05-25 ENCOUNTER — Ambulatory Visit: Admit: 2020-05-25 | Payer: PRIVATE HEALTH INSURANCE | Primary: Internal Medicine

## 2020-05-25 DIAGNOSIS — E785 Hyperlipidemia, unspecified: Secondary | ICD-10-CM

## 2020-05-25 DIAGNOSIS — N202 Calculus of kidney with calculus of ureter: Secondary | ICD-10-CM

## 2020-05-25 DIAGNOSIS — N201 Calculus of ureter: Secondary | ICD-10-CM

## 2020-05-25 DIAGNOSIS — Z87442 Personal history of urinary calculi: Secondary | ICD-10-CM

## 2020-05-25 DIAGNOSIS — Z885 Allergy status to narcotic agent status: Secondary | ICD-10-CM

## 2020-05-25 DIAGNOSIS — Z7984 Long term (current) use of oral hypoglycemic drugs: Secondary | ICD-10-CM

## 2020-05-25 DIAGNOSIS — Z7982 Long term (current) use of aspirin: Secondary | ICD-10-CM

## 2020-05-25 DIAGNOSIS — N200 Calculus of kidney: Secondary | ICD-10-CM

## 2020-05-25 DIAGNOSIS — Z79899 Other long term (current) drug therapy: Secondary | ICD-10-CM

## 2020-05-25 DIAGNOSIS — E119 Type 2 diabetes mellitus without complications: Secondary | ICD-10-CM

## 2020-05-25 MED ORDER — CEFAZOLIN IV PUSH 1 GRAM VIAL & 0.9% SODIUM CHLORIDE (ADULT)
Freq: Once | INTRAVENOUS | Status: DC
Start: 2020-05-25 — End: 2020-05-25

## 2020-05-25 MED ORDER — MIDAZOLAM (PF) 1 MG/ML INJECTION SOLUTION
1 mg/mL | INTRAVENOUS | Status: DC | PRN
Start: 2020-05-25 — End: 2020-05-25

## 2020-05-25 MED ORDER — FENTANYL (PF) 50 MCG/ML INJECTION SOLUTION
50 mcg/mL | Status: CP
Start: 2020-05-25 — End: ?

## 2020-05-25 MED ORDER — NALOXONE 0.4 MG/ML INJECTION SOLUTION
0.4 mg/mL | INTRAVENOUS | Status: DC | PRN
Start: 2020-05-25 — End: 2020-05-25

## 2020-05-25 MED ORDER — CHLORHEXIDINE GLUCONATE 0.12 % MOUTHWASH
0.12 % | Status: CP
Start: 2020-05-25 — End: ?
  Administered 2020-05-25: 14:00:00 0.12 % via OROMUCOSAL

## 2020-05-25 MED ORDER — PHENAZOPYRIDINE 100 MG TABLET
100 mg | Freq: Once | ORAL | Status: CP | PRN
Start: 2020-05-25 — End: ?
  Administered 2020-05-25: 17:00:00 100 mg via ORAL

## 2020-05-25 MED ORDER — PHENAZOPYRIDINE 100 MG TABLET
100 mg | ORAL_TABLET | Freq: Three times a day (TID) | ORAL | 1 refills | Status: AC
Start: 2020-05-25 — End: ?

## 2020-05-25 MED ORDER — CEFAZOLIN 1 GRAM SOLUTION FOR INJECTION
1 gram | INTRAVENOUS | Status: DC | PRN
Start: 2020-05-25 — End: 2020-05-25
  Administered 2020-05-25: 16:00:00 1 gram via INTRAVENOUS

## 2020-05-25 MED ORDER — WATER FOR IRRIGATION, STERILE SOLUTION
Status: DC | PRN
Start: 2020-05-25 — End: 2020-05-25
  Administered 2020-05-25: 15:00:00

## 2020-05-25 MED ORDER — HYDROMORPHONE 0.5 MG/0.5 ML INJECTION SYRINGE
0.50.5 mg/ mL | INTRAVENOUS | Status: DC | PRN
Start: 2020-05-25 — End: 2020-05-25

## 2020-05-25 MED ORDER — IOTHALAMATE MEGLUMINE 30 % INTRAVENOUS SOLUTION
30 % | Status: DC | PRN
Start: 2020-05-25 — End: 2020-05-25
  Administered 2020-05-25: 15:00:00 30 %

## 2020-05-25 MED ORDER — FENTANYL (PF) 50 MCG/ML INJECTION SOLUTION
50 mcg/mL | INTRAVENOUS | Status: DC | PRN
Start: 2020-05-25 — End: 2020-05-25
  Administered 2020-05-25 (×2): 50 mcg/mL via INTRAVENOUS

## 2020-05-25 MED ORDER — ONDANSETRON HCL (PF) 4 MG/2 ML INJECTION SOLUTION
42 mg/2 mL | INTRAVENOUS | Status: DC | PRN
Start: 2020-05-25 — End: 2020-05-25

## 2020-05-25 MED ORDER — PROPOFOL 10 MG/ML INTRAVENOUS EMULSION
10 mg/mL | INTRAVENOUS | Status: DC | PRN
Start: 2020-05-25 — End: 2020-05-25
  Administered 2020-05-25 (×2): 10 mg/mL via INTRAVENOUS

## 2020-05-25 MED ORDER — FENTANYL (PF) 50 MCG/ML INJECTION SOLUTION
50 mcg/mL | INTRAVENOUS | Status: DC | PRN
Start: 2020-05-25 — End: 2020-05-25

## 2020-05-25 MED ORDER — ONDANSETRON HCL (PF) 4 MG/2 ML INJECTION SOLUTION
4 mg/2 mL | INTRAVENOUS | Status: DC | PRN
Start: 2020-05-25 — End: 2020-05-25
  Administered 2020-05-25: 16:00:00 4 mg/2 mL via INTRAVENOUS

## 2020-05-25 MED ORDER — SODIUM CHLORIDE 0.9 % (FLUSH) INJECTION SYRINGE
0.9 % | Freq: Three times a day (TID) | INTRAVENOUS | Status: DC
Start: 2020-05-25 — End: 2020-05-25

## 2020-05-25 MED ORDER — DIMENHYDRINATE 5 MG/ML IN 0.9% SODIUM CHLORIDE
INTRAVENOUS | Status: DC | PRN
Start: 2020-05-25 — End: 2020-05-25

## 2020-05-25 MED ORDER — CHLORHEXIDINE GLUCONATE 0.12 % MOUTHWASH
0.12 % | Freq: Once | OROMUCOSAL | Status: CP
Start: 2020-05-25 — End: ?
  Administered 2020-05-25: 14:00:00 0.12 mL via OROMUCOSAL

## 2020-05-25 MED ORDER — LACTATED RINGERS INTRAVENOUS SOLUTION
INTRAVENOUS | Status: DC | PRN
Start: 2020-05-25 — End: 2020-05-25
  Administered 2020-05-25: 16:00:00 via INTRAVENOUS

## 2020-05-25 MED ORDER — HYDROMORPHONE (PF) 0.2 MG/ML INJECTION SYRINGE
0.2 mg/mL | INTRAVENOUS | Status: DC | PRN
Start: 2020-05-25 — End: 2020-05-25
  Administered 2020-05-25: 17:00:00 0.2 mL via INTRAVENOUS

## 2020-05-25 MED ORDER — SODIUM CHLORIDE 0.9 % IRRIGATION SOLUTION
0.9 % irrigation | Status: CP | PRN
Start: 2020-05-25 — End: ?
  Administered 2020-05-25: 15:00:00 0.9 % irrigation

## 2020-05-25 MED ORDER — SODIUM CHLORIDE 0.9 % (FLUSH) INJECTION SYRINGE
0.9 % | INTRAVENOUS | Status: DC | PRN
Start: 2020-05-25 — End: 2020-05-25

## 2020-05-25 MED ORDER — ACETAMINOPHEN 1,000 MG/100 ML (10 MG/ML) INTRAVENOUS SOLUTION
10 mg/mL | INTRAVENOUS | Status: DC | PRN
Start: 2020-05-25 — End: 2020-05-25
  Administered 2020-05-25: 16:00:00 10 mg/mL via INTRAVENOUS

## 2020-05-25 NOTE — Brief Op Note
[facility]        [id number] Patient DOB: 1965-08-14     Surgery Date: 3/14/2022Surgeon(s) and Role:   * Hesse, Milda Smart., MD - PrimaryAssistant(s):Resident: Roberto Scales, MD; Roselie Skinner, MDStaff:  Circulator: Daguplo, Vivien Rota, RNRelief Circulator: Crist Infante, RNRelief Scrub: Garner Nash, EboneeScrub Person: Nally-Gall, SuzannePre-Op Diagnosis: Right kidney stone [N20.0] Procedure(s) and Anesthesia Type:   * CYSTOSCOPY, RIGHT URETEROSCOPY, LASER LITHOTRIPSY AND STENT - GENERAL   * CYSTOURETHROSCOPY, W/INSERTION, INDWELLING URETERAL STENT - GENERALOperative Findings (enter relevant operative findings; do not refer to an operative report that is not yet transcribed): Right mid ureteral stone basket extracted; right renal stone lasered and basket extracted; stent left on stringSigns of infection present at the time of surgery at the operative site: None Blood and Blood Products: none                 Drains:  noneImplants: Implant Name Type Inv. Item Serial No. Manufacturer Lot No. LRB No. Used Action STENT URETERAL PERCUFLEX 6-26 - [id number] Implant STENT URETERAL PERCUFLEX 6-26  [employer] [id number] Right 1 Explanted STENT ASCERTA 761F X 26CM - [id number] Implant STENT ASCERTA 761F X 26CM  [employer] [id number] Right 1 Replacement Device  Specimens: ID Type Source Tests Collected by Time 1 : RIGHT URETERAL STONE Calculus Ureter, Right PATHOLOGY [facility]) Royann Shivers., MD 05/25/2020 12:20 PM  Clinical Staging: N/AEBL: Minimal       Post Operative Diagnosis: * No post-op diagnosis entered Roberto Scales, MD3/14/202212:38 PM

## 2020-05-25 NOTE — Telephone Encounter
Patient is scheduled for stent removal on 3/17

## 2020-05-25 NOTE — Other
Operative Diagnosis:Pre-op:   Right kidney stone [N20.0] Patient Coded Diagnosis   Pre-op diagnosis: Right kidney stone  Post-op diagnosis: Right kidney stone  Patient Diagnosis   Pre-op diagnosis: Right kidney stone [N20.0]  Post-op diagnosis:     Post-op diagnosis:   * Right kidney stone [N20.0]Operative Procedure(s) :Procedure(s) (LRB):CYSTOSCOPY, RIGHT URETEROSCOPY, LASER LITHOTRIPSY AND STENT (Right)CYSTOURETHROSCOPY, W/INSERTION, INDWELLING URETERAL STENT (Right)Post-op Procedure & Diagnosis ConfirmationPost-op Diagnosis: Post-op Diagnosis confirmed (no changes)Post-op Procedure: Post-op Procedure confirmed (no changes)

## 2020-05-25 NOTE — Discharge Instructions
Below are some guidelines to follow while recovering from your surgery.What to expect:- After a transurethral surgery, you may feel some pain or spasms when you urinate, or the urge to urinate more often.  - It is normal to have some blood in your urine for the first few days after surgery. Blood in the urine is like food coloring, so even a drop can turn the urine very red.Activities: - You may return to your normal activities tomorrow, but refrain from strenuous activity and heavy lifting for the next 2-3 weeksMedications: - Take pyridium for burning with urination. It will turn your urine orange.- Take Flomax daily for stent colic. - Take tylenol for pain. You can take up to the maximum dose indicated on the label. Do not take more than 4,000mg  of total acetaminophen per day.- Remember to drink lots of water and take gentle laxative/stool softener to prevent constipation. Call your doctor for:- Being unable to keep food down - Difficulty voiding or inability to urinate despite the urge to urinate- Increased abdominal pain, distention, nausea or vomiting - Fevers/chills- Shortness of breath or chest pain that does not resolve with rest- Questions or concerns!Ureteral Stent: - A ureteral stent is a temporary, thin, soft, plastic tube that helps maintain the flow of urine through the ureter. Stents are placed in the operating room to unblock the ureter and are routinely used in the treatment of kidney stones. The stent is completely inside (you cannot see it) with one end sitting in the kidney and the other in the bladder.  - After a stent is placed, you may feel a pulling sensation when you urinate, a spasm at the end of urination or the urge to urinate more often. If you hold your urine too long, you may notice a dull ache in your back. - If you are experiencing moderate stent discomfort, place a warm compress or hot water bottle on you back or belly where you ache. A hot bath or shower may also offer relief.- All stents must be removed before 3-4 months otherwise you will risk infections, stones, or kidney damage. - You will have a follow up appointment where your stent will be removed. Follow up:You will follow up with Dr. Pleas Patricia in about 2 weeks. Prior to that you will have your stent removed this Thursday with a urology nurse in clinic. A scheduler will call you to schedule your appointment. If you do not hear from them, please call the urology clinic.For any questions or concerns, please call the Urology Clinic at [phone]

## 2020-05-25 NOTE — Other
Patient with history of nephrolithiasis, s/p cysto/emergengy stent placement of R DJ ureteral stent 03/16/2020, for obstructing right 7 mm proximal ureteral calculus.  Now returns for interval ureteroscopy, laser lithotripsy with basket stone removal, possible DJ stent exchange.ZO:XWRUE clear to auscultatioCOR: RRRA/P:  To OR for above described procedure.  Risks/benefits of procedure reviewed with patient.

## 2020-05-25 NOTE — Anesthesia Post-Procedure Evaluation
Anesthesia Post-op NotePatient: Kenneth PalmaProcedure(s):  Procedure(s) (LRB):CYSTOSCOPY, RIGHT URETEROSCOPY, LASER LITHOTRIPSY AND STENT (Right)CYSTOURETHROSCOPY, W/INSERTION, INDWELLING URETERAL STENT (Right) Patient location: PACULast Vitals:  I have noted the vital signs as listed in the nursing notes.Mental status recovered: patient participates in evaluation: YesVital signs reviewed: YesRespiratory function stable:YesAirway is patent: YesCardiovascular function and hydration status stable: YesPain control satisfactory: YesNausea and vomiting control satisfactory:YesNo complications documented.

## 2020-05-25 NOTE — Telephone Encounter
1st attempt [facility]

## 2020-05-25 NOTE — Telephone Encounter
Patient needs:Nurse Visit: on Thursday 3/17 Other Nurse Visit (Please specify) Ureteral stent on string removal on 3/17 Thursday in clinicFuture Appointments     Provider Department Center  06/10/2020 10:00 AM Pleas Patricia, Milda Smart., MD [facility] at [address] [place] [facility]

## 2020-05-25 NOTE — Other
Post Anesthesia Transfer of Care NotePatient: Kenneth PalmaProcedure(s) Performed: Procedure(s) (LRB):CYSTOSCOPY, RIGHT URETEROSCOPY, LASER LITHOTRIPSY AND STENT (Right)CYSTOURETHROSCOPY, W/INSERTION, INDWELLING URETERAL STENT (Right) Patient location: PACU Last Vitals: Vitals Value Taken Time BP 146/79 05/25/20 1250 Temp 98.42f 05/25/20 1250 Pulse 98 05/25/20 1250 Resp 12 05/25/20 1250 SpO2 100 05/25/20 1250 Level of consciousness: awake, alert  and orientedTransport Vital Signs:  Stable since the last set of recorded intra-operative vital signsIntra-operative Complications: noneIntra-operative Intake & Output and Antibiotics as per Anesthesia record and discussed with the RN.

## 2020-05-25 NOTE — Anesthesia Pre-Procedure Evaluation
This is a 55 y.o. male scheduled for CYSTOSCOPY, RIGHT URETEROSCOPY, LASER LITHOTRIPSY AND STENT (Right )CYSTOURETHROSCOPY, W/INSERTION, INDWELLING URETERAL STENT (Right ).Review of Systems/ Medical HistoryPatient summary, nursing notes, EKG/Cardiac Studies , Labs, pre-procedure vitals, height, weight and NPO status reviewed.No previous anesthesia concernsAnesthesia Evaluation: Estimated body mass index is 34.97 kg/m? as calculated from the following:  Height as of this encounter: 5' 8 (1.727 m).  Weight as of this encounter: 104.3 kg. CC/HPI: 55 y.o. male w/ PMH of HLD, DM and nephrolithiasis who was recently admitted to [facility] from 12/31 - 01/01 due to intractable right flank pain from a right 7mm proximal ureteral stone with hydronephrosis. Pain was well controlled and therefore patient was discharged to home. Past Surgical History:  TONSILLECTOMY COLONOSCOPY Gastrointestinal/Genitourinary: -Comments: [place] ABDOMEN PELVIS W IV CONTRAST?HISTORY: Right-sided flank and abdominal pain, with leukocytosis. History of nephrolithiasis.?COMPARISON: [place] abdomen pelvis 02/17/2020?TECHNIQUE: [place] images of the abdomen and pelvis were obtained from the diaphragms to the pubic symphysis after the administration of 80 cc intravenous contrast (Omnipaque 350).??FINDINGS:LUNG BASES: Unremarkable.?LIVER: Hepatic steatosis is seen.GALLBLADDER: There are gallstones.SPLEEN: Unremarkable.PANCREAS: Unremarkable.ADRENALS: Unremarkable.KIDNEYS: There is a 7 mm stone in the right proximal ureter with mild upstream dilatation of the collecting system associated with new proximal periureteral and perinephric fat stranding. Additional nonobstructing right renal stones are noted measuring up to 6 mm. The previously seen left distal ureteral stone is no longer present, with interval resolution of left perinephric fat stranding and collecting system dilatation.?BOWEL: No evidence of bowel obstruction is seen.APPENDIX: Unremarkable.PERITONEUM: No intraperitoneal free fluid. No intraperitoneal free air.?LYMPH NODES: No lymphadenopathy is seen.VESSELS: The abdominal aorta is atherosclerotic but nonaneurysmal.?URINARY BLADDER: Unremarkable.PELVIS: The prostate is mildly enlarged.?BONES & SOFT TISSUE: No aggressive osseous lesion identified. Small fat-containing inguinal hernias, left greater than right. Diastasis of the rectus abdominis muscles is again noted. There is a small fat-containing umbilical hernia.?IMPRESSION:?Obstructing proximal right ureteral calculus with mild hydroureteronephrosis.?These findings were discussed via telephone with PA Arlie Solomons by Dr. Janice Norrie on 03/13/2020 at 11:35 AM.?Report Initiated By:  Greggory Brandy, MD?Reported And Signed By: Jacinta Shoe, MDEndocrine/Metabolic: -Diabetes mellitus:  Patient has diabetes mellitus.Physical ExamCardiovascular:    Rhythm: regularHeart Sounds: S1 present and S2 present.Pulmonary:  Patient's breath sounds clear to auscultationAirway:  Mallampati: IITM distance: >3 FBNeck ROM: fullDental:  normal exam  Anesthesia PlanASA 3 The primary anesthesia plan is  general LMA. Perioperative Code Status confirmed: It is my understanding that the patient is currently designated as 'Full Code' and will remain so throughout the perioperative period.Anesthesia informed consent obtained. Consent obtained from: patientThe post operative pain plan is IV analgesics.Plan discussed with Attending and CRNA.Anesthesiologist's Pre Op NoteI personally evaluated and examined the patient prior to the intra-operative phase of care.

## 2020-05-26 NOTE — Other
[facility]           Operative NoteCONFIDENTIAL - DO NOT COPY WITHOUT APPROPRIATE AUTHORIZATION Name: Kenneth Bates: [id number] CSN: [id number] Service Area: UrologyDate of Birth: March 17, 1965 Date of Adm: 3/14/2022Date of Procedure/Surgery: 3/14/2022Operation: CystoscopyRight ureteroscopyLaser lithotripsyBasket extractionRight ureteral stent exchangeRight retrograde pyelogram with interpretation of imagesPre-Operative Diagnosis: Right proximal ureteral stonePost-Operative Diagnosis: Right mid ureteral stoneRight renal stoneSurgeon: Attending: Royann Bates., MDAssistant: Resident: Kenneth Scales, Bates; Kenneth Bates, MDAnesthesia: ANESTHESIOLOGIST: Kenneth Bates, MDCRNA: Kenneth Bates, CRNAType: GeneralEstimated Blood Loss: MinimalSpecimens: 1. RIght ureteral stoneDrains/Devices/Implants:1. 6Fr x 26cm Ascerta double J ureteral stentFindings: Debris in bladder, existing right ureteral stent removedRight proximal ureteral stone migrated to mid-ureter, basket extracted with semi-rigid ureteroscopeUreteral access sheath placed and renal pelvis entered with flexible ureteroscope, right renal stone lasered and basket extractedRight ureteral stent left on string taped to penisComplications: NoneDisposition: PACUIndications: Kenneth Bates is Bates 55 y.o. gentleman with Bates history of nephrolithiasis s/p cystoscopy and emergent right ureteral stent placement on 03/16/2020 for obstructing right 7mm proximal ureteral calculus. He now presents for definitive stone treatment. Procedure:Informed consent was confirmed, with patient having the opportunity to ask any relevant questions prior to the procedure. The patient was transferred to the operating room. Bates time out compliant with Universal Protocol was performed with the patient awake and participating. General anesthesia was induced. Prophylactic antibiotics were administered. DVT prophylaxis was used in the form of sequential compression devices. The patient was positioned in dorsal lithotomy with all pressure points appropriately padded. The surgical field was sterilely prepared and draped in the standard fashion. Prior to starting the procedure, an additional time out was performed to again confirm patient, positioning, procedure, laterality, preoperative medications, and operative and anesthesia plans. We first entered the bladder atraumatically with Bates 30 degree cystoscope and Bates 22Fr sheath. Routine surveillance cystoscopy revealed existing right ureteral stent with curl in bladder. Bilateral ureteral orifices were in their orthotopic positions. The right ureteral orifice was first cannulated with Bates 5Fr open ended pollack over Bates 0.035 sensor wire. 10cc of Omnipaque was injected into the open ended pollack catheter and Bates retrograde pyelogram was performed, which demonstrated stone had migrated to right mid ureter. The cystoscope was exchanged for Bates grasper which was then used to explant the existing right ureteral stent.The cystoscope was removed from the bladder and it was drained. The semi rigid ureteroscope was then introduced into the bladder. With the assistance of Bates second wire, the ureteroscope was advanced into the right ureteral orifice. We encountered the right mid ureteral stone. Bates zero tip nitinol basket was introduced and used to remove the stone. We then removed the ureteroscope and  placed Bates second wire into the kidney under fluoroscopic guidance. Bates 12x30fr x 36cm  ureteral sheath was advanced under fluoroscopy to the proximal ureter, ensuring we were distal to the visualized stone using fluoroscopy. The wire was removed and the flexible ureteroscope introduced to the sheath. We then entered the renal pelvis and complete Bates thorough exploration of all calyces. The stone was visualized. Bates 200 micron MOSES laser fiber was introduced and used to fragment the stone. Once the stone had been sufficiently fragmented, the zero tip nitinol basket was inserted and used to extract all visible stone fragments. Pull down ureteroscopy was performed and the sheath and ureteroscope were removed.Bates 6Fr x 26cm double J ureteral stent on Bates string was advanced over the wire. Proximal coiling in the kidney and distal coiling in the bladder were confirmed via fluoroscopy. The string emerged from  the patient's penis and was taped to his shaft. Bates surgical debrief was performed. The patient was awakened from anesthesia and transferred to the recovery room in stable, satisfactory condition. There were no immediate complications.Dr. Pleas Bates, Kenneth Bates., Bates was present for all portions of the procedure.Plan:Stent on Bates string to be removed in clinic on Thursday.

## 2020-05-28 ENCOUNTER — Encounter: Admit: 2020-05-28 | Payer: PRIVATE HEALTH INSURANCE | Primary: Internal Medicine

## 2020-05-28 ENCOUNTER — Ambulatory Visit: Admit: 2020-05-28 | Payer: PRIVATE HEALTH INSURANCE | Attending: Urology | Primary: Internal Medicine

## 2020-05-28 DIAGNOSIS — E119 Type 2 diabetes mellitus without complications: Secondary | ICD-10-CM

## 2020-05-28 DIAGNOSIS — N200 Calculus of kidney: Secondary | ICD-10-CM

## 2020-05-28 DIAGNOSIS — E785 Hyperlipidemia, unspecified: Secondary | ICD-10-CM

## 2020-05-28 DIAGNOSIS — N201 Calculus of ureter: Secondary | ICD-10-CM

## 2020-05-28 NOTE — Progress Notes
Kenneth Bates was seen in the office today for a nurse visit right stent on a string removal. Stent removed intact and charted. Patient tolerated the procedure well. Patient will drink plenty of water.

## 2020-06-10 ENCOUNTER — Ambulatory Visit: Admit: 2020-06-10 | Payer: PRIVATE HEALTH INSURANCE | Attending: Urology | Primary: Internal Medicine

## 2020-06-10 ENCOUNTER — Encounter: Admit: 2020-06-10 | Payer: PRIVATE HEALTH INSURANCE | Attending: Urology | Primary: Internal Medicine

## 2020-06-10 DIAGNOSIS — N200 Calculus of kidney: Secondary | ICD-10-CM

## 2020-06-10 DIAGNOSIS — E119 Type 2 diabetes mellitus without complications: Secondary | ICD-10-CM

## 2020-06-10 DIAGNOSIS — N399 Disorder of urinary system, unspecified: Secondary | ICD-10-CM

## 2020-06-10 DIAGNOSIS — E785 Hyperlipidemia, unspecified: Secondary | ICD-10-CM

## 2020-06-10 NOTE — Progress Notes
HPITodd Bates is a 55 y.o. male who presents with a chief complaint [id number] Diagnoses Name SNOMED [place](R) Primary? ? Nephrolithiasis KIDNEY STONE Yes ? 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 Stone analysis showed calcium oxalate; pathology reviewed with patient. Today, the patient returns for 2 week postoperative evaluation with review of symptoms. He states that stent is removed and he is feeling better. Past Medical HistoryPast Medical History: Diagnosis Date ? Diabetes mellitus (HC Code) (HC CODE)  ? Hyperlipidemia  ? Kidney stone  Past Surgical HistoryPast Surgical History: Procedure Laterality Date ? COLONOSCOPY   ? TONSILLECTOMY   AllergiesAllergies Allergen Reactions ? Codeine Hives MedicationsOutpatient Encounter Medications as of 06/10/2020 Medication Sig Dispense Refill ? acetaminophen (TYLENOL) 500 mg tablet Take 1,000 mg by mouth every 6 (six) hours as needed.   ? aspirin 81 mg EC delayed release tablet Take 81 mg by mouth every evening.   ? atorvastatin (LIPITOR) 10 mg tablet Take 10 mg by mouth every evening.    ? cholecalciferol, vitamin D3, 25 mcg (1,000 unit) tablet Take 1,000 Units by mouth daily.   ? cyanocobalamin 1000 MCG tablet Take 1,000 mcg by mouth daily.   ? metFORMIN (GLUCOPHAGE) 500 mg Immediate Release tablet Take 500 mg by mouth 2 (two) times daily with breakfast and dinner.    ? ibuprofen (ADVIL,MOTRIN) 800 mg tablet Take 1 tablet (800 mg total) by mouth every 8 (eight) hours as needed. (Patient not taking: Reported on 06/10/2020) 21 tablet 0 ? oxyCODONE (ROXICODONE) 5 mg Immediate Release tablet Take 1 tablet (5 mg total) by mouth every 6 (six) hours as needed (pain). 5 tablet 0 ? oxyCODONE (ROXICODONE) 5 mg Immediate Release tablet Take 1 tablet (5 mg total) by mouth every 6 (six) hours as needed (pain). 10 tablet 0 ? oxyCODONE-acetaminophen (PERCOCET) 5-325 mg per tablet Take 1 tablet by mouth every 8 (eight) hours as needed (pain). 12 tablet 0 No facility-administered encounter medications on file as of 06/10/2020.  Social HistorySocial History Tobacco Use ? Smoking status: Never Smoker ? Smokeless tobacco: Never Used Vaping Use ? Vaping Use: Never used Substance Use Topics ? Alcohol use: No ? Drug use: No  Family History History reviewed. No pertinent family history.Review of SystemsNo symptoms beyond those noted in HPI and PMH- all other systems are negative.Physical ExamBP 125/84 (Site: r a, Position: Sitting)  - Pulse (!) 93  - Temp 98 ?F (36.7 ?C)  - Ht 5' 8 (1.727 m)  - Wt 99.8 kg  - SpO2 97%  - BMI 33.45 kg/m? Constitutional: Oriented to person, place, and time. Appears well-developed and well-nourished. Head: Normocephalic and atraumatic. Eyes: Conjunctivae are normal. Neck: No tracheal deviation present. No supraclavicular adenopathy. Pulmonary/Chest: Effort normal. Abdominal: Soft and non-tender.Musculoskeletal: Normal range of motion. Neurological: Alert and oriented to person, place, and time. No focal deficits are evident. GU: No suprapubic tenderness. No inguinal adenopathy. Normal phallus. Perineum benign, scrotal contents benign.Skin: Skin is dry. Psychiatric: Has a normal mood and affect. Behavior is normal. Judgment and thought content normal. Lab Results Component Value Date  UCOLOR Yellow 06/10/2020  UGLUCOSE Negative 06/10/2020  UKETONE Negative 06/10/2020  USPECGRAVITY 1.010 06/10/2020  UPROTEIN Negative 06/10/2020  UNITRATES Negative 06/10/2020  UBLOOD Negative 06/10/2020  ULEUKOCYTES Negative 06/10/2020   No results found for: PVRPOCTLab Results Component Value Date  BUN 11 03/16/2020 Lab Results Component Value Date  CREATININE 0.90  03/16/2020 Assessment & Plan:Kenneth Bates is a 55 y.o. male Encounter Diagnoses Name SNOMED [place](R) Primary? ? Nephrolithiasis KIDNEY STONE Yes ? 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 US Renal ? POC urinalysis manual w/o scope Patient recommended to keep hydrated and avoid salt.Recommended we monitor his nephrolithiasis with f/u renal US in 1 month.Return in 1 month (on 07/11/2020) for reevaluation via MyChart video visit for review of renal US. Patient will let us know if any new problems or symptoms arise in the interim.Milda Smart Coren Crownover, MDScribed for [employer]. Pleas Patricia, MD by Rossie Muskrat, medical scribe March 30, 2022The documentation recorded by the scribe accurately reflects the services I personally performed and the decisions made by me. I reviewed and confirmed all material entered and/or pre-charted by the scribe.

## 2020-07-14 ENCOUNTER — Inpatient Hospital Stay: Admit: 2020-07-14 | Discharge: 2020-07-14 | Payer: PRIVATE HEALTH INSURANCE | Primary: Internal Medicine

## 2020-07-14 DIAGNOSIS — N200 Calculus of kidney: Secondary | ICD-10-CM

## 2020-07-15 ENCOUNTER — Ambulatory Visit: Admit: 2020-07-15 | Payer: PRIVATE HEALTH INSURANCE | Attending: Urology | Primary: Internal Medicine

## 2020-08-17 ENCOUNTER — Telehealth: Admit: 2020-08-17 | Payer: PRIVATE HEALTH INSURANCE | Attending: Urology | Primary: Internal Medicine

## 2020-08-17 ENCOUNTER — Ambulatory Visit: Admit: 2020-08-17 | Payer: PRIVATE HEALTH INSURANCE | Attending: Urology | Primary: Internal Medicine

## 2020-08-17 DIAGNOSIS — N200 Calculus of kidney: Secondary | ICD-10-CM

## 2020-08-17 NOTE — Telephone Encounter
Kenneth Bates needs to be rescheduled for in person visit. Unable to figure out mychart video x3 now.

## 2020-08-19 NOTE — Telephone Encounter
LVM to schedule a F2F appointment with Dr. Pleas Patricia per previous message. Please assist in scheduling upon return call. Thanks

## 2020-08-23 NOTE — Telephone Encounter
Spoke with Cheral Almas he states he will call back to schedule.

## 2020-08-23 NOTE — Progress Notes
Patient could not log on to MyChart.

## 2020-12-21 ENCOUNTER — Inpatient Hospital Stay: Admit: 2020-12-21 | Discharge: 2020-12-21 | Payer: PRIVATE HEALTH INSURANCE | Primary: Internal Medicine

## 2020-12-21 DIAGNOSIS — R059 Cough: Secondary | ICD-10-CM

## 2020-12-22 DIAGNOSIS — Z20822 Contact with and (suspected) exposure to covid-19: Secondary | ICD-10-CM

## 2020-12-22 LAB — SARS COV-2 (COVID-19) RNA-~~LOC~~ LABS (BH GH LMW YH): BKR SARS-COV-2 RNA (COVID-19) (YH): NOT DETECTED

## 2021-03-11 ENCOUNTER — Inpatient Hospital Stay: Admit: 2021-03-11 | Discharge: 2021-03-11 | Payer: PRIVATE HEALTH INSURANCE | Primary: Internal Medicine

## 2021-03-11 DIAGNOSIS — Z20822 Contact with and (suspected) exposure to covid-19: Secondary | ICD-10-CM

## 2021-03-11 DIAGNOSIS — R059 Cough: Secondary | ICD-10-CM

## 2021-03-12 LAB — SARS COV-2 (COVID-19) RNA-~~LOC~~ LABS (BH GH LMW YH): BKR SARS-COV-2 RNA (COVID-19) (YH): NOT DETECTED

## 2023-05-17 ENCOUNTER — Encounter: Admit: 2023-05-17 | Payer: PRIVATE HEALTH INSURANCE | Attending: Orthopedic Surgery | Primary: Internal Medicine
